# Patient Record
Sex: Male | Born: 1965 | Race: White | Hispanic: No | Marital: Single | Smoking: Current every day smoker
Health system: Southern US, Community
[De-identification: ages and names within clinical notes are randomized; demographics above are authoritative.]

## PROBLEM LIST (undated history)

## (undated) DIAGNOSIS — R001 Bradycardia, unspecified: Secondary | ICD-10-CM

## (undated) DIAGNOSIS — N529 Male erectile dysfunction, unspecified: Secondary | ICD-10-CM

## (undated) DIAGNOSIS — I10 Essential (primary) hypertension: Secondary | ICD-10-CM

## (undated) DIAGNOSIS — M25529 Pain in unspecified elbow: Secondary | ICD-10-CM

## (undated) DIAGNOSIS — F419 Anxiety disorder, unspecified: Secondary | ICD-10-CM

## (undated) DIAGNOSIS — M549 Dorsalgia, unspecified: Secondary | ICD-10-CM

## (undated) DIAGNOSIS — G5600 Carpal tunnel syndrome, unspecified upper limb: Secondary | ICD-10-CM

## (undated) DIAGNOSIS — K219 Gastro-esophageal reflux disease without esophagitis: Secondary | ICD-10-CM

## (undated) DIAGNOSIS — M1990 Unspecified osteoarthritis, unspecified site: Secondary | ICD-10-CM

## (undated) HISTORY — DX: Dorsalgia, unspecified: M54.9

## (undated) HISTORY — PX: WRIST SURGERY: SHX841

## (undated) HISTORY — DX: Pain in unspecified elbow: M25.529

## (undated) HISTORY — PX: KNEE ARTHROSCOPY: SUR90

## (undated) HISTORY — DX: Anxiety disorder, unspecified: F41.9

## (undated) HISTORY — DX: Unspecified osteoarthritis, unspecified site: M19.90

## (undated) HISTORY — PX: FOOT SURGERY: SHX648

## (undated) HISTORY — DX: Male erectile dysfunction, unspecified: N52.9

## (undated) HISTORY — PX: KNEE SURGERY: SHX244

---

## 1998-03-15 ENCOUNTER — Emergency Department (HOSPITAL_COMMUNITY): Admission: EM | Admit: 1998-03-15 | Discharge: 1998-03-15 | Payer: Self-pay | Admitting: Emergency Medicine

## 1998-03-15 ENCOUNTER — Encounter: Payer: Self-pay | Admitting: Emergency Medicine

## 2004-11-02 ENCOUNTER — Emergency Department (HOSPITAL_COMMUNITY): Admission: EM | Admit: 2004-11-02 | Discharge: 2004-11-02 | Payer: Self-pay | Admitting: Family Medicine

## 2005-08-26 ENCOUNTER — Emergency Department (HOSPITAL_COMMUNITY): Admission: EM | Admit: 2005-08-26 | Discharge: 2005-08-26 | Payer: Self-pay | Admitting: Emergency Medicine

## 2005-08-28 ENCOUNTER — Emergency Department (HOSPITAL_COMMUNITY): Admission: EM | Admit: 2005-08-28 | Discharge: 2005-08-28 | Payer: Self-pay | Admitting: Emergency Medicine

## 2005-09-22 ENCOUNTER — Emergency Department (HOSPITAL_COMMUNITY): Admission: EM | Admit: 2005-09-22 | Discharge: 2005-09-22 | Payer: Self-pay | Admitting: Emergency Medicine

## 2006-07-30 ENCOUNTER — Emergency Department (HOSPITAL_COMMUNITY): Admission: EM | Admit: 2006-07-30 | Discharge: 2006-07-30 | Payer: Self-pay | Admitting: Emergency Medicine

## 2007-07-04 ENCOUNTER — Emergency Department (HOSPITAL_COMMUNITY): Admission: EM | Admit: 2007-07-04 | Discharge: 2007-07-04 | Payer: Self-pay | Admitting: Emergency Medicine

## 2008-07-12 ENCOUNTER — Emergency Department (HOSPITAL_COMMUNITY): Admission: EM | Admit: 2008-07-12 | Discharge: 2008-07-12 | Payer: Self-pay | Admitting: Emergency Medicine

## 2008-08-31 ENCOUNTER — Emergency Department (HOSPITAL_COMMUNITY): Admission: EM | Admit: 2008-08-31 | Discharge: 2008-08-31 | Payer: Self-pay | Admitting: Emergency Medicine

## 2009-03-25 ENCOUNTER — Encounter: Admission: RE | Admit: 2009-03-25 | Discharge: 2009-03-25 | Payer: Self-pay | Admitting: Family Medicine

## 2009-05-09 ENCOUNTER — Encounter
Admission: RE | Admit: 2009-05-09 | Discharge: 2009-06-10 | Payer: Self-pay | Admitting: Physical Medicine and Rehabilitation

## 2009-06-12 ENCOUNTER — Encounter
Admission: RE | Admit: 2009-06-12 | Discharge: 2009-07-06 | Payer: Self-pay | Admitting: Physical Medicine and Rehabilitation

## 2011-11-20 ENCOUNTER — Encounter (HOSPITAL_COMMUNITY): Payer: Self-pay | Admitting: *Deleted

## 2011-11-20 ENCOUNTER — Emergency Department (HOSPITAL_COMMUNITY)
Admission: EM | Admit: 2011-11-20 | Discharge: 2011-11-21 | Disposition: A | Discharge status: Home / Self Care | Payer: Medicaid Other | Attending: Emergency Medicine | Admitting: Emergency Medicine

## 2011-11-20 DIAGNOSIS — R21 Rash and other nonspecific skin eruption: Secondary | ICD-10-CM | POA: Insufficient documentation

## 2011-11-20 DIAGNOSIS — F17200 Nicotine dependence, unspecified, uncomplicated: Secondary | ICD-10-CM | POA: Insufficient documentation

## 2011-11-20 NOTE — ED Notes (Signed)
Pt c/o getting bit by something left leg three days ago; since then increased areas of irritation and redness noted

## 2011-11-21 LAB — CBC
HCT: 40.4 % (ref 39.0–52.0)
Hemoglobin: 14 g/dL (ref 13.0–17.0)
MCH: 31.1 pg (ref 26.0–34.0)
MCHC: 34.7 g/dL (ref 30.0–36.0)
MCV: 89.8 fL (ref 78.0–100.0)
Platelets: 271 10*3/uL (ref 150–400)
RBC: 4.5 MIL/uL (ref 4.22–5.81)
RDW: 12.9 % (ref 11.5–15.5)
WBC: 9.9 10*3/uL (ref 4.0–10.5)

## 2011-11-21 MED ORDER — HYDROCORTISONE 1 % EX CREA: TOPICAL_CREAM | CUTANEOUS | Status: AC

## 2011-11-21 NOTE — ED Provider Notes (Signed)
History     CSN: [id number]  Arrival date & time 11/20/11  2219   First MD Initiated Contact with Patient 11/20/11 2333      Chief Complaint  Patient presents with  . Insect Bite    (Consider location/radiation/quality/duration/timing/severity/associated sxs/prior treatment) HPI Comments: Patient reports that he noticed erythematous patches on the anterior left lower leg three days ago.  He thinks that he may have been bitten by an insect, but he did not see or feel an insect bite him.  He has never had anything like this before.  The area burns and itches.  He has not tried applying any treatment to the area.    Patient is a 46 y.o. male presenting with rash. The history is provided by the patient.  Rash  This is a new problem. Episode onset: 3 days ago. The problem has not changed since onset.Associated with: possible insect bite. There has been no fever. The rash is present on the left lower leg. Associated symptoms include itching. Pertinent negatives include no blisters and no weeping. He has tried nothing for the symptoms.    History reviewed. No pertinent past medical history.  Past Surgical History  Procedure Date  . Knee arthroscopy     No family history on file.  History  Substance Use Topics  . Smoking status: Current Everyday Smoker -- 1.0 packs/day  . Smokeless tobacco: Not on file  . Alcohol Use: Yes     social      Review of Systems  Constitutional: Negative for fever and chills.  HENT: Negative for sore throat, neck pain and neck stiffness.   Respiratory: Negative for shortness of breath.   Gastrointestinal: Negative for nausea and vomiting.  Skin: Positive for itching and rash. Negative for wound.  Neurological: Negative for numbness and headaches.  Hematological: Does not bruise/bleed easily.    Allergies  Review of patient's allergies indicates no known allergies.  Home Medications   Current Outpatient Rx  Name Route Sig Dispense Refill  .  HYDROCODONE-ACETAMINOPHEN 10-325 MG PO TABS Oral Take 1 tablet by mouth every 6 (six) hours as needed.    . IBUPROFEN 800 MG PO TABS Oral Take 800 mg by mouth every 8 (eight) hours as needed.    Marland Kitchen HYDROCORTISONE 1 % EX CREA  Apply to affected area 2 times daily 15 g 0    BP 132/92  Pulse 88  Temp 98.7 F (37.1 C) (Oral)  Resp 18  Wt 225 lb (102.059 kg)  SpO2 97%  Physical Exam  Nursing note and vitals reviewed. Constitutional: He appears well-developed and well-nourished. No distress.  HENT:  Head: Normocephalic and atraumatic.  Mouth/Throat: Oropharynx is clear and moist.  Neck: Normal range of motion. Neck supple.  Cardiovascular: Normal rate, regular rhythm and normal heart sounds.   Pulmonary/Chest: Effort normal and breath sounds normal.  Musculoskeletal: Normal range of motion.  Neurological: He is alert.  Skin: Skin is warm and dry. He is not diaphoretic.     Psychiatric: He has a normal mood and affect.    ED Course  Procedures (including critical care time)   Labs Reviewed  CBC  LAB REPORT - SCANNED   No results found.   1. Rash       MDM  Patient presenting with rash.  CBC checked to evaluate platelet count.  Platelet count normal.  Patient has a PCP.  Patient instructed to follow up with his PCP and also given follow up with Dermatology.  Pascal Lux [place], PA-C 11/21/11 2330  Pascal Lux [place], PA-C 11/21/11 [phone]

## 2011-11-22 NOTE — ED Provider Notes (Signed)
Medical screening examination/treatment/procedure(s) were performed by non-physician practitioner and as supervising physician I was immediately available for consultation/collaboration.  Ulonda Klosowski, MD 11/22/11 2346 

## 2013-09-19 ENCOUNTER — Emergency Department (HOSPITAL_COMMUNITY)
Admission: EM | Admit: 2013-09-19 | Discharge: 2013-09-19 | Disposition: A | Discharge status: Home / Self Care | Payer: Medicaid Other | Attending: Emergency Medicine | Admitting: Emergency Medicine

## 2013-09-19 ENCOUNTER — Encounter (HOSPITAL_COMMUNITY): Payer: Self-pay | Admitting: Emergency Medicine

## 2013-09-19 DIAGNOSIS — M545 Low back pain, unspecified: Secondary | ICD-10-CM | POA: Insufficient documentation

## 2013-09-19 DIAGNOSIS — G8929 Other chronic pain: Secondary | ICD-10-CM

## 2013-09-19 DIAGNOSIS — F17200 Nicotine dependence, unspecified, uncomplicated: Secondary | ICD-10-CM | POA: Insufficient documentation

## 2013-09-19 DIAGNOSIS — M549 Dorsalgia, unspecified: Secondary | ICD-10-CM

## 2013-09-19 MED ORDER — HYDROCODONE-ACETAMINOPHEN 5-325 MG PO TABS: 1.0000 | ORAL_TABLET | ORAL | Status: DC | PRN

## 2013-09-19 MED ORDER — KETOROLAC TROMETHAMINE 30 MG/ML IJ SOLN
30.0000 mg | Freq: Once | INTRAMUSCULAR | Status: AC
  Administered 2013-09-19: 30 mg via INTRAMUSCULAR
  Filled 2013-09-19: qty 1

## 2013-09-19 MED ORDER — DEXAMETHASONE SODIUM PHOSPHATE 10 MG/ML IJ SOLN
10.0000 mg | Freq: Once | INTRAMUSCULAR | Status: AC
  Administered 2013-09-19: 10 mg via INTRAMUSCULAR
  Filled 2013-09-19: qty 1

## 2013-09-19 MED ORDER — NAPROXEN 500 MG PO TABS: 500.0000 mg | ORAL_TABLET | Freq: Two times a day (BID) | ORAL | Status: DC

## 2013-09-19 MED ORDER — METHOCARBAMOL 500 MG PO TABS: 500.0000 mg | ORAL_TABLET | Freq: Two times a day (BID) | ORAL | Status: DC | PRN

## 2013-09-19 NOTE — ED Provider Notes (Signed)
CSN: [id number]     Arrival date & time 09/19/13  1922 History   First MD Initiated Contact with Patient 09/19/13 2018     Chief Complaint  Patient presents with  . Back Pain    HPI  Jon GuadalajaraWilliam K Hodges is a 48 y.o. male with a PMH of chronic back pain who presents to the ED for evaluation of back pain. History was provided by the patient. Patient states he has had chronic lower back pain for the past 10 years, which has been worse for the past two weeks. Pain is unchanged from his chronic lower back pain. Pain worse with movement. Pain located is his lower middle back without radiation. Pain is a constant and described as aching. Patient has tried Ibuprofen and Robaxin with no relief. Patient has seen Dr. Ethelene Halamos with [facility] in the past but stopping seeing him due to lack of insurance. Denies any injuries or trauma. No weakness, loss of sensation, numbness/tingling, loss of bowel/bladder function, abdominal pain, nausea, emesis, chest pain or SOB. No fevers, weight loss, night sweats, hx of cancer or IV drug use.    History reviewed. No pertinent past medical history. Past Surgical History  Procedure Laterality Date  . Knee arthroscopy    . Foot surgery    . Wrist surgery    . Knee surgery     History reviewed. No pertinent family history. History  Substance Use Topics  . Smoking status: Current Every Day Smoker -- 1.00 packs/day  . Smokeless tobacco: Not on file  . Alcohol Use: Yes     Comment: social    Review of Systems  Constitutional: Negative for fever, chills, diaphoresis, activity change, appetite change and fatigue.  Respiratory: Negative for cough and shortness of breath.   Cardiovascular: Negative for chest pain and leg swelling.  Gastrointestinal: Negative for nausea, vomiting, abdominal pain, diarrhea and constipation.  Genitourinary: Negative for dysuria and difficulty urinating.  Musculoskeletal: Positive for back pain. Negative for arthralgias, gait  problem, joint swelling, myalgias and neck pain.  Skin: Negative for color change and wound.  Neurological: Negative for dizziness, weakness, light-headedness, numbness and headaches.    Allergies  Review of patient's allergies indicates no known allergies.  Home Medications   Current Outpatient Rx  Name  Route  Sig  Dispense  Refill  . ibuprofen (ADVIL,MOTRIN) 600 MG tablet   Oral   Take 600 mg by mouth every 6 (six) hours as needed for mild pain.         . methocarbamol (ROBAXIN) 500 MG tablet   Oral   Take 500 mg by mouth 4 (four) times daily.          BP 100/71  Pulse 104  Temp(Src) 97.3 F (36.3 C) (Oral)  Resp 16  SpO2 98%  Filed Vitals:   09/19/13 1941 09/19/13 2112  BP: 100/71 151/74  Pulse: 104 79  Temp: 97.3 F (36.3 C)   TempSrc: Oral   Resp: 16 16  SpO2: 98% 97%    Physical Exam  Nursing note and vitals reviewed. Constitutional: He is oriented to person, place, and time. He appears well-developed and well-nourished. No distress.  HENT:  Head: Normocephalic and atraumatic.  Right Ear: External ear normal.  Left Ear: External ear normal.  Mouth/Throat: Oropharynx is clear and moist.  Eyes: Conjunctivae are normal. Right eye exhibits no discharge. Left eye exhibits no discharge.  Neck: Normal range of motion. Neck supple.  No cervical spinal or paraspinal tenderness to  palpation throughout.  No limitations with neck ROM.    Cardiovascular: Normal rate, regular rhythm, normal heart sounds and intact distal pulses.  Exam reveals no gallop and no friction rub.   No murmur heard. Dorsalis pedis pulses present and equal bilaterally  Pulmonary/Chest: Effort normal and breath sounds normal. No respiratory distress. He has no wheezes. He has no rales. He exhibits no tenderness.  Abdominal: Soft. He exhibits no distension and no mass. There is no tenderness. There is no rebound and no guarding.  Musculoskeletal: Normal range of motion. He exhibits  tenderness. He exhibits no edema.       Back:  Tenderness to palpation to the lower middle spine and paraspinal muscles diffusely. Negative straight leg raise. Pain worse with sitting up. Strength 5/5 in the upper and lower extremities bilaterally. Patient able to ambulate without difficulty or ataxia.   Neurological: He is alert and oriented to person, place, and time.  Patellar reflexes intact bilaterally. Sensation intact in the LE bilaterally  Skin: Skin is warm and dry. He is not diaphoretic.  No ecchymosis, edema, erythema or wounds throughout    ED Course  Procedures (including critical care time) Labs Review Labs Reviewed - No data to display Imaging Review No results found.   EKG Interpretation None      MDM   REVAN GENDRON is a 48 y.o. male with a PMH of chronic back pain who presents to the ED for evaluation of back pain. Back pain is chronic and unchanged from his musculoskeletal back pain in the past. Patient has an orthopedic physician who is managing his back pain. No warning signs or symptoms of back pain including loss of bowel or bladder control, night sweats, waking from sleep with back pain, unexplained fevers or weight loss, history of cancer, or IV drug use. No concern for cauda equina, epidural abscess, or other serious/life threatening cause of back pain. Patient instructed to follow-up with his PCP and or orthopedic specialist. RICE method discussed. Medications prescribed which have worked for the patient in the past. Return precautions, discharge instructions, and follow-up was discussed with the patient before discharge.     Rechecks  9:30 PM = Pain improving. Patient ready for discharge.      Discharge Medication List as of 09/19/2013  9:32 PM    START taking these medications   Details  HYDROcodone-acetaminophen (NORCO/VICODIN) 5-325 MG per tablet Take 1 tablet by mouth every 4 (four) hours as needed., Starting 09/19/2013, Until Discontinued, Print     !! methocarbamol (ROBAXIN) 500 MG tablet Take 1 tablet (500 mg total) by mouth 2 (two) times daily as needed for muscle spasms., Starting 09/19/2013, Until Discontinued, Print    naproxen (NAPROSYN) 500 MG tablet Take 1 tablet (500 mg total) by mouth 2 (two) times daily with a meal., Starting 09/19/2013, Until Discontinued, Print     !! - Potential duplicate medications found. Please discuss with provider.       Final impressions: 1. Chronic back pain       Greer Ee Tayven Renteria PA-C          Jillyn Ledger, [place] 09/20/13 1534

## 2013-09-19 NOTE — ED Notes (Signed)
Patient is alert and oriented x3.  He is complaining of back pain that he has been dealing with chronically  With the help of an orthopedic doctor but his insurance stopped and he could not continue due to expense. Currently he rates his pain 10 of 10.  He is able to ambulate but does have difficulty.

## 2013-09-19 NOTE — Discharge Instructions (Signed)
Take naprosyn twice daily with food  Take robaxin for muscle spasm - take at night - Please be careful with this medication.  It can cause drowsiness.  Use caution while driving, operating machinery, drinking alcohol, or any other activities that may impair your physical or mental abilities.   Take Vicodin as needed for severe break through pain - careful with combining this with robaxin - this can make your drowsy - Please be careful with this medication.  It can cause drowsiness.  Use caution while driving, operating machinery, drinking alcohol, or any other activities that may impair your physical or mental abilities.   Return to the emergency department if you develop any changing/worsening condition, loss of bowel/bladder function, weakness, loss of sensation, fever, or any other concerns (please read additional information regarding your condition below) Back Pain, Adult Low back pain is very common. About 1 in 5 people have back pain.The cause of low back pain is rarely dangerous. The pain often gets better over time.About half of people with a sudden onset of back pain feel better in just 2 weeks. About 8 in 10 people feel better by 6 weeks.  CAUSES Some common causes of back pain include:  Strain of the muscles or ligaments supporting the spine.  Wear and tear (degeneration) of the spinal discs.  Arthritis.  Direct injury to the back. DIAGNOSIS Most of the time, the direct cause of low back pain is not known.However, back pain can be treated effectively even when the exact cause of the pain is unknown.Answering your caregiver's questions about your overall health and symptoms is one of the most accurate ways to make sure the cause of your pain is not dangerous. If your caregiver needs more information, he or she may order lab work or imaging tests (X-rays or MRIs).However, even if imaging tests show changes in your back, this usually does not require surgery. HOME CARE  INSTRUCTIONS For many people, back pain returns.Since low back pain is rarely dangerous, it is often a condition that people can learn to [facility] their own.   Remain active. It is stressful on the back to sit or stand in one place. Do not sit, drive, or stand in one place for more than 30 minutes at a time. Take short walks on level surfaces as soon as pain allows.Try to increase the length of time you walk each day.  Do not stay in bed.Resting more than 1 or 2 days can delay your recovery.  Do not avoid exercise or work.Your body is made to move.It is not dangerous to be active, even though your back may hurt.Your back will likely heal faster if you return to being active before your pain is gone.  Pay attention to your body when you bend and lift. Many people have less discomfortwhen lifting if they bend their knees, keep the load close to their bodies,and avoid twisting. Often, the most comfortable positions are those that put less stress on your recovering back.  Find a comfortable position to sleep. Use a firm mattress and lie on your side with your knees slightly bent. If you lie on your back, put a pillow under your knees.  Only take over-the-counter or prescription medicines as directed by your caregiver. Over-the-counter medicines to reduce pain and inflammation are often the most helpful.Your caregiver may prescribe muscle relaxant drugs.These medicines help dull your pain so you can more quickly return to your normal activities and healthy exercise.  Put ice on the injured area.  Put ice in a plastic bag.  Place a towel between your skin and the bag.  Leave the ice on for 15-20 minutes, 03-04 times a day for the first 2 to 3 days. After that, ice and heat may be alternated to reduce pain and spasms.  Ask your caregiver about trying back exercises and gentle massage. This may be of some benefit.  Avoid feeling anxious or stressed.Stress increases muscle tension and  can worsen back pain.It is important to recognize when you are anxious or stressed and learn ways to manage it.Exercise is a great option. SEEK MEDICAL CARE IF:  You have pain that is not relieved with rest or medicine.  You have pain that does not improve in 1 week.  You have new symptoms.  You are generally not feeling well. SEEK IMMEDIATE MEDICAL CARE IF:   You have pain that radiates from your back into your legs.  You develop new bowel or bladder control problems.  You have unusual weakness or numbness in your arms or legs.  You develop nausea or vomiting.  You develop abdominal pain.  You feel faint. Document Released: 05/28/2005 Document Revised: 11/27/2011 Document Reviewed: 10/16/2010 [facility] Patient Information 2014 [place], [place].   Emergency Department Resource Guide 1) Find a Doctor and Pay Out of Pocket Although you won't have to find out who is covered by your insurance plan, it is a good idea to ask around and get recommendations. You will then need to call the office and see if the doctor you have chosen will accept you as a new patient and what types of options they offer for patients who are self-pay. Some doctors offer discounts or will set up payment plans for their patients who do not have insurance, but you will need to ask so you aren't surprised when you get to your appointment.  2) Contact Your Local Health Department Not all health departments have doctors that can see patients for sick visits, but many do, so it is worth a call to see if yours does. If you don't know where your local health department is, you can check in your phone book. The CDC also has a tool to help you locate your state's health department, and many state websites also have listings of all of their local health departments.  3) Find a Walk-in Clinic If your illness is not likely to be very severe or complicated, you may want to try a walk in clinic. These are popping up all over  the country in pharmacies, drugstores, and shopping centers. They're usually staffed by nurse practitioners or physician assistants that have been trained to treat common illnesses and complaints. They're usually fairly quick and inexpensive. However, if you have serious medical issues or chronic medical problems, these are probably not your best option.  No Primary Care Doctor: - Call Health Connect at  [phone] - they can help you locate a primary care doctor that  accepts your insurance, provides certain services, etc. - Physician Referral Service- [phone]  Chronic Pain Problems: Organization         Address  Phone   Notes  [facility]  [phone] Patients need to be referred by their primary care doctor.   Medication Assistance: Organization         Address  Phone   Notes  [facility] Medication [facility] [address] [place], [place] [address] [phone] --Must be a resident of [facility] -- Must  have NO insurance coverage whatsoever (no Medicaid/ Medicare, etc.) -- The pt. MUST have a primary care doctor that directs their care regularly and follows them in the community   [facility]  [phone]   [employer]  [phone]    Agencies that provide inexpensive medical care: Organization         Address  Phone   Notes  [facility]  [phone]   [facility]    [phone]   [facility] [address] [place], [place] [address] [phone]   [facility] [address] [place] [place] [phone]   Planned Parenthood    [phone]   [facility]    [phone]   [facility]  [address], [place] Phone:  [phone], Fax:  [phone] Hours of Operation:  9 am - 6 pm, M-F.  Also accepts Medicaid/Medicare and self-pay.  [facility]  [address]  [address], [place] Phone: [phone], Fax: [phone]. Hours of Operation:  8:30 am - 5:30 pm, M-F.  Also accepts Medicaid and self-pay.  [facility] [place] [address] [place] Phone: [phone]   [facility] [address] Natasha Bence [place], [place] [phone], Ext. [phone] Mondays & Thursdays: 7-9 AM.  First 15 patients are seen on a first come, first serve basis.    Medicaid-accepting [facility] Providers:  Organization         Address  Phone   Notes  [facility] [address], [place] [phone] Also accepts self-pay patients.  [facility] [address] Laurell Josephs [place], [place]  [phone]   [facility] [address], [place] [phone]   [facility] [address] [place] [phone]   Renaye Rakers [address], [place]   [phone] Only accepts [place] Access [place] patients after they have their name applied to their card.   Self-Pay (no insurance) in [facility]:  Organization         Address  Phone   Notes  Sickle Cell Patients, [facility] [address] [place], [place] [phone]   [facility] [address] [place], [place] [phone]   [facility] [place]  [address], [place] [phone]   [facility]/Dr. Osei-Bonsu  [address], [place] or [address], [place] [phone] Phone number for both [place] and [employer] locations is the same.  [facility] [address], [place] [phone]   [facility] [address] [place] or [address] [phone]   [facility] [address] [place] [phone], phone; [phone], fax Sees patients 1st and 3rd Saturday of every month.   Must not qualify for public or private insurance (i.e. Medicaid, Medicare, [place] Health Choice, Veterans' Benefits)  Household income should be no more than 200% of the poverty level The clinic cannot treat you if you are pregnant or think you are pregnant  Sexually transmitted diseases are not treated at the clinic.    Dental Care: Organization         Address  Phone  Notes  [facility] Department of [facility] [address] [place], [place] [phone] Accepts children up to age 75 who are enrolled in [place] or [place] Health Choice; pregnant women with a Medicaid card; and children who have applied for Medicaid or [place] Health Choice, but were declined, whose parents can pay a reduced fee at time of service.  [facility] Department of [facility]  [address], [place] [phone] Accepts children up to age 40 who are enrolled in [place] or [place] Health Choice; pregnant women with a Medicaid card; and children who have applied for Medicaid or [place] Health Choice, but were declined, whose parents can pay a reduced fee at time of service.  [facility]  [address] [place], [place] [phone] Patients are seen by appointment only. Walk-ins are not accepted. [facility] will see patients 9 years of age and older. Monday - Tuesday (8am-5pm) Most Wednesdays (8:30-5pm) $30 per visit, cash only  [facility] Adult [facility] Access [facility]  [address], [facility] [phone] Patients are seen by appointment only. Walk-ins are not accepted. [facility] will see patients 4 years of age and older. One Wednesday Evening (Monthly: Volunteer Based).  $30 per visit, cash only  [employer]  [phone] for adults; Children under age 45, call Graduate Pediatric Dentistry at [phone]. Children aged 98-14, please call [phone] to request a pediatric application.  Dental services are  provided in all areas of dental care including fillings, crowns and bridges, complete and partial dentures, implants, gum treatment, root canals, and extractions. Preventive care is also provided. Treatment is provided to both adults and children. Patients are selected via a lottery and there is often a waiting list.   [facility] [address], [place]  [phone] [email or web address]   [facility] [address] [place], [place] [phone], Ext. [phone] Second and Fourth Thursday of each month, opens at 6:30 AM; Clinic ends at 9 AM.  Patients are seen on a first-come first-served basis, and a limited number are seen during each clinic.   [facility]  [address] Ether Griffins [place], [place] [phone]   Eligibility Requirements You must have lived in [place], [place], or [place] counties for at least the last three months.   You cannot be eligible for state or federal sponsored [place], including [employer], [place], or [employer].   You generally cannot be eligible for healthcare insurance through your employer.    How to apply: Eligibility screenings are held every Tuesday and Wednesday afternoon from 1:00 pm until 4:00 pm. You do not need an appointment for the interview!  [facility] [address], [place], [place] [phone]   [facility] Health Department  [phone]   [facility] Health Department  [phone]   [facility] Health Department  [phone]    Behavioral Health Resources in the Community: Intensive Outpatient Programs Organization         Address  Phone  Notes  [facility] [address] [place], [place] [phone]   [facility] Outpatient [address], [place], [place] [phone]   [facility] [address] [place], [place]  [phone]   [facility] Mental Health [address]  [place], [place]  [phone] or [phone]   Substance Abuse Resources Organization  Address  Phone  Notes  [facility]  4197317933   [facility]  [phone]   [facility] [place]  [phone]   Floydene Flock  [phone]   Residential & Outpatient Substance Abuse Program  [phone]   Psychological Services Organization         Address  Phone  Notes  [facility] Behavioral Health  [phone]   [facility]  [phone]   [facility] Mental Health [address], [place] [phone] or [phone]    Mobile Crisis Teams Organization         Address  Phone  Notes  [facility], Mobile Crisis Care Unit  [phone]   [facility]  [address] [place], [place] [phone]   Doristine Locks [address] [place] [phone]    Self-Help/Support Groups Organization         Address  Phone             Notes  [facility] [place] - variety of support groups  336- I7437963 Call for more information  Narcotics Anonymous (NA), [facility] [address], [employer] [place]  2 meetings at this location   Statistician         Address  Phone  Notes  [facility] [address],    [place]  [phone]   [facility]  [address], [place] [address], [place], [place] [phone]   [facility] [address] [place], [place] [phone] Admissions: 8am-3pm M-F  [facility] [address]    [place], [place] [phone]   [facility] [address] [place], [place], [place] [phone]   [facility] [address]  [place], [place] [phone]   [facility] - Intensive Outpatient [address]urell [address], [place], [place] [phone]   [facility] ([facility] [address]  [place], [place] [phone] or  [phone]   [facility] ([facility]) [address], [place], [place] [phone] Accepts Medicaid  [facility] [address]  [place] [phone] Substance Abuse/Addiction Treatment   [facility] Organization         Address  Phone  Notes  [facility]  [phone]   Angie Fava, PhD [address] Ervin Knack [place], [place]   [phone] or [phone]   [facility]   [address] [place], [place] [phone]   [facility] [address] [place], [place] [phone] Insurance/Medicaid/sponsorship through [facility] and Families [address]                                    [place], [place] [phone] Therapy/tele-psych/case  [facility] [address] [place], [place] [phone]    Dr. Lolly Mustache  [phone]   [facility]  [employer] [facility] Dept. 1) [address], [place] 2) [address] [place] 3)  [address], [place] [phone]  [phone]   [facility] Child Abuse Hotline 507-693-3690 or [phone] (After Hours)

## 2013-09-20 NOTE — ED Provider Notes (Signed)
Medical screening examination/treatment/procedure(s) were performed by non-physician practitioner and as supervising physician I was immediately available for consultation/collaboration.    Melady Chow R Emmah Bratcher, MD 09/20/13 2352 

## 2013-09-21 ENCOUNTER — Emergency Department (HOSPITAL_COMMUNITY)
Admission: EM | Admit: 2013-09-21 | Discharge: 2013-09-21 | Disposition: A | Discharge status: Home / Self Care | Payer: Medicaid Other | Attending: Emergency Medicine | Admitting: Emergency Medicine

## 2013-09-21 ENCOUNTER — Encounter (HOSPITAL_COMMUNITY): Payer: Self-pay | Admitting: Emergency Medicine

## 2013-09-21 DIAGNOSIS — M545 Low back pain, unspecified: Secondary | ICD-10-CM | POA: Insufficient documentation

## 2013-09-21 DIAGNOSIS — R634 Abnormal weight loss: Secondary | ICD-10-CM | POA: Insufficient documentation

## 2013-09-21 DIAGNOSIS — G8929 Other chronic pain: Secondary | ICD-10-CM | POA: Insufficient documentation

## 2013-09-21 DIAGNOSIS — Z791 Long term (current) use of non-steroidal anti-inflammatories (NSAID): Secondary | ICD-10-CM | POA: Insufficient documentation

## 2013-09-21 DIAGNOSIS — Z79899 Other long term (current) drug therapy: Secondary | ICD-10-CM | POA: Insufficient documentation

## 2013-09-21 DIAGNOSIS — F17200 Nicotine dependence, unspecified, uncomplicated: Secondary | ICD-10-CM | POA: Insufficient documentation

## 2013-09-21 DIAGNOSIS — R52 Pain, unspecified: Secondary | ICD-10-CM | POA: Insufficient documentation

## 2013-09-21 MED ORDER — OXYCODONE-ACETAMINOPHEN 5-325 MG PO TABS: 1.0000 | ORAL_TABLET | ORAL | Status: DC | PRN

## 2013-09-21 MED ORDER — IBUPROFEN 800 MG PO TABS: 800.0000 mg | ORAL_TABLET | Freq: Three times a day (TID) | ORAL | Status: DC

## 2013-09-21 MED ORDER — HYDROMORPHONE HCL PF 1 MG/ML IJ SOLN
1.0000 mg | Freq: Once | INTRAMUSCULAR | Status: AC
  Administered 2013-09-21: 1 mg via INTRAMUSCULAR
  Filled 2013-09-21: qty 1

## 2013-09-21 MED ORDER — DIAZEPAM 5 MG PO TABS
5.0000 mg | ORAL_TABLET | Freq: Two times a day (BID) | ORAL | Status: DC
Start: 2013-09-21 — End: 2015-12-27

## 2013-09-21 MED ORDER — KETOROLAC TROMETHAMINE 60 MG/2ML IM SOLN
60.0000 mg | Freq: Once | INTRAMUSCULAR | Status: AC
  Administered 2013-09-21: 60 mg via INTRAMUSCULAR
  Filled 2013-09-21: qty 2

## 2013-09-21 NOTE — ED Provider Notes (Signed)
CSN: [id number]     Arrival date & time 09/21/13  1535 History  This chart was scribed for non-physician practitioner, Margaret PyleKatie SchinIver, PA-C working with Glynn OctaveStephen Rancour, MD by Luisa DagoPriscilla Tutu, ED scribe. This patient was seen in room TR06C/TR06C and the patient's care was started at 6:25 PM.    Chief Complaint  Patient presents with  . Back Pain    The history is provided by the patient. No language interpreter was used.   HPI Comments: Carnella GuadalajaraWilliam K Lawrence is a 48 y.o. male with a history of chronic back pain (10 years) who presents to the Emergency Department requesting a re-evaluation of his constant back pain that started to worsen about 3 weeks ago. He states that his back pain has been worsening in the past 2 weeks Pt states that he has been without insurance and unable to [facility]. Mr. Luiz BlareGraves describes his back pain as "a burning sensation".He states that [facility] and was prescribed 6 Hydrocodone-acetaminophen, methocarbamol 500 MG, and Naproxen 500 MG. Mr. Luiz BlareGraves says that the medication [facility] are not relieving his pain. Pt states that pain is relieved by applying pressure. Denies any recent injury. He also denies any weakness, saddle paraesthesia, fever, chills, abdominal pain, bowel or bladder incontinence.   Wife states that pt has lost about 25 lbs due to the chronic back pain.   Pt states that he is trying to schedule and appointment with an orthopedist. But they were told that the are booked right now but they may be able to get him in on Friday.   History reviewed. No pertinent past medical history. Past Surgical History  Procedure Laterality Date  . Knee arthroscopy    . Foot surgery    . Wrist surgery    . Knee surgery     History reviewed. No pertinent family history. History  Substance Use Topics  . Smoking status: Current Every Day Smoker -- 1.00 packs/day  .  Smokeless tobacco: Not on file  . Alcohol Use: Yes     Comment: social    Review of Systems  Musculoskeletal: Positive for back pain.  All other systems reviewed and are negative.     Allergies  Review of patient's allergies indicates no known allergies.  Home Medications   Current Outpatient Rx  Name  Route  Sig  Dispense  Refill  . HYDROcodone-acetaminophen (NORCO/VICODIN) 5-325 MG per tablet   Oral   Take 1 tablet by mouth every 4 (four) hours as needed.   6 tablet   0   . ibuprofen (ADVIL,MOTRIN) 600 MG tablet   Oral   Take 600 mg by mouth every 6 (six) hours as needed for mild pain.         . methocarbamol (ROBAXIN) 500 MG tablet   Oral   Take 500 mg by mouth 4 (four) times daily.         . methocarbamol (ROBAXIN) 500 MG tablet   Oral   Take 1 tablet (500 mg total) by mouth 2 (two) times daily as needed for muscle spasms.   20 tablet   0   . naproxen (NAPROSYN) 500 MG tablet   Oral   Take 1 tablet (500 mg total) by mouth 2 (two) times daily with a meal.   30 tablet   0    BP 134/73  Pulse 85  Temp(Src) 97.5 F (36.4 C) (Oral)  Resp 20  Wt 217 lb 8 oz (98.657 kg)  SpO2 100%  Physical Exam  Nursing note and vitals reviewed. Constitutional: He is oriented to person, place, and time. He appears well-developed and well-nourished.  Uncomfortable appearing.  HENT:  Head: Normocephalic and atraumatic.  Eyes:  Normal appearance  Neck: Normal range of motion.  Cardiovascular: Normal rate and regular rhythm.   Pulmonary/Chest: Effort normal and breath sounds normal. No respiratory distress.  Genitourinary:  No CVA ttp  Musculoskeletal: Normal range of motion.  Entire low back non-tender. Full active ROM of LE.  Nml patellar reflexes.  No saddle anesthesia. Distal sensation intact.  2+ DP pulses.  Ambulates w/out diffulty.   Neurological: He is alert and oriented to person, place, and time.  Skin: Skin is warm and dry. No rash noted.  Psychiatric:  He has a normal mood and affect. His behavior is normal.    ED Course  Procedures (including critical care time)  DIAGNOSTIC STUDIES: Oxygen Saturation is 100% on RA, normal by my interpretation.    COORDINATION OF CARE: 6:32 PM- Will give him an injection of pain medication and antiinflammatory medication. Pt advised of plan for treatment and pt agrees.  Labs Review Labs Reviewed - No data to display Imaging Review No results found.   EKG Interpretation None      MDM   Final diagnoses:  Chronic low back pain    47yo M presents w/ acute on chronic low back pain.  H/o facet arthropathy. Second visit in 2 days but has not been seen for this problem here in the past.  [facility] squeeze him in for f/u this week.  On exam, afebrile, non-toxic appearing, NAD, no bony ttp, no NV deficits BLE, ambulatory.  Treated w/ 1mg  IM dilaudid and 60mg  toradol, and d/c'd home w/ percocet, valium and 800mg  ibuprofen.  Advised close f/u w/ Dr. Ethelene Halamos. Return precautions discussed. 6:41 PM   I personally performed the services described in this documentation, which was scribed in my presence. The recorded information has been reviewed and is accurate.    Arie Sabinaatherine E Leeon Makar, PA-C 09/22/13 0200

## 2013-09-21 NOTE — Discharge Instructions (Signed)
Take percocet and/or valium as needed for severe pain.  Do not drive within four hours of taking these medications (may cause drowsiness or confusion).   Take ibuprofen as well; up to 800mg  three times a day with food.  Apply a heating pad or ice pack to your lower back multiple times a day.  Avoid activities that aggravate pain.   Follow up with Dr. Ethelene Halamos asap.  You should return to the ER if you develop change in or worsening of pain, fever (100.5 degrees or greater), inability to walk due to leg weakness or loss of control of bladder/bowels.

## 2013-09-21 NOTE — ED Notes (Signed)
Pt in c/o chronic back pain, states he has been without insurance and unable to see his PMD, went to [facility] for same a few days ago and was given some medications but pain has returned, denies new injuries

## 2013-09-22 NOTE — ED Provider Notes (Signed)
Medical screening examination/treatment/procedure(s) were performed by non-physician practitioner and as supervising physician I was immediately available for consultation/collaboration.   EKG Interpretation None       Milda Lindvall, MD 09/22/13 0206 

## 2013-10-16 ENCOUNTER — Other Ambulatory Visit: Payer: Self-pay | Admitting: Orthopaedic Surgery

## 2013-10-16 DIAGNOSIS — M545 Low back pain, unspecified: Secondary | ICD-10-CM

## 2013-11-07 ENCOUNTER — Ambulatory Visit
Admission: RE | Admit: 2013-11-07 | Discharge: 2013-11-07 | Disposition: A | Discharge status: Home / Self Care | Payer: Medicaid Other | Source: Ambulatory Visit | Attending: Orthopaedic Surgery | Admitting: Orthopaedic Surgery

## 2013-11-07 DIAGNOSIS — M545 Low back pain, unspecified: Secondary | ICD-10-CM

## 2013-11-16 ENCOUNTER — Other Ambulatory Visit: Payer: Medicaid Other

## 2015-12-27 ENCOUNTER — Emergency Department (HOSPITAL_COMMUNITY)
Admission: EM | Admit: 2015-12-27 | Discharge: 2015-12-27 | Disposition: A | Discharge status: Home / Self Care | Payer: Medicaid Other | Attending: Emergency Medicine | Admitting: Emergency Medicine

## 2015-12-27 ENCOUNTER — Emergency Department (HOSPITAL_COMMUNITY): Payer: Medicaid Other

## 2015-12-27 ENCOUNTER — Encounter (HOSPITAL_COMMUNITY): Payer: Self-pay | Admitting: Neurology

## 2015-12-27 DIAGNOSIS — Y9389 Activity, other specified: Secondary | ICD-10-CM | POA: Insufficient documentation

## 2015-12-27 DIAGNOSIS — Y929 Unspecified place or not applicable: Secondary | ICD-10-CM | POA: Diagnosis not present

## 2015-12-27 DIAGNOSIS — M25512 Pain in left shoulder: Secondary | ICD-10-CM

## 2015-12-27 DIAGNOSIS — F17200 Nicotine dependence, unspecified, uncomplicated: Secondary | ICD-10-CM | POA: Diagnosis not present

## 2015-12-27 DIAGNOSIS — X503XXA Overexertion from repetitive movements, initial encounter: Secondary | ICD-10-CM | POA: Insufficient documentation

## 2015-12-27 DIAGNOSIS — Y990 Civilian activity done for income or pay: Secondary | ICD-10-CM | POA: Diagnosis not present

## 2015-12-27 HISTORY — DX: Carpal tunnel syndrome, unspecified upper limb: G56.00

## 2015-12-27 MED ORDER — CYCLOBENZAPRINE HCL 5 MG PO TABS: 5.0000 mg | ORAL_TABLET | Freq: Three times a day (TID) | ORAL | Status: DC | PRN

## 2015-12-27 MED ORDER — CYCLOBENZAPRINE HCL 10 MG PO TABS
5.0000 mg | ORAL_TABLET | Freq: Once | ORAL | Status: AC
  Administered 2015-12-27: 5 mg via ORAL
  Filled 2015-12-27: qty 1

## 2015-12-27 MED ORDER — NAPROXEN 500 MG PO TABS: ORAL_TABLET | ORAL | Status: DC

## 2015-12-27 MED ORDER — HYDROCODONE-ACETAMINOPHEN 5-325 MG PO TABS
1.0000 | ORAL_TABLET | Freq: Once | ORAL | Status: AC
  Administered 2015-12-27: 1 via ORAL
  Filled 2015-12-27: qty 1

## 2015-12-27 NOTE — Discharge Instructions (Signed)
Mr. Jon Hodges,  There was no sign of fracture or dislocation of your left shoulder on x-ray. Since pain improved with muscle relaxant, your pain is likely musculoskeletal. I have prescribed naproxen (like a strong ibuprofen) and muscle relaxant flexeril. Flexeril can make you sleepy, so you may want to use this before bed if you go back to work. Heat may also help the pain. If pain does not improve over next few days, please call Sport Medicine for an appointment. Their number is [phone].

## 2015-12-27 NOTE — ED Provider Notes (Signed)
CSN: [id number]     Arrival date & time 12/27/15  0700 History   First MD Initiated Contact with Patient 12/27/15 [phone]     Chief Complaint  Patient presents with  . Shoulder Pain   (Consider location/radiation/quality/duration/timing/severity/associated sxs/prior Treatment) HPI  Jon Hodges is a 49-y/o male with history of tobacco abuse, psoriasis, lumbar degenerative changes and R carpal tunnel syndrome who presents with acute L shoulder pain since he woke up this morning around 6 a.m. Pain is localized to L anterior shoulder. He denies sensation of chest pressure. He has a very physical job in [employer] but did not work yesterday. He denies any previous shoulder pain or injury to the area. No headache, abdominal pain, back pain, fevers or chills. No vomiting or nausea.   Past Medical History  Diagnosis Date  . Carpal tunnel syndrome    Past Surgical History  Procedure Laterality Date  . Knee arthroscopy    . Foot surgery    . Wrist surgery    . Knee surgery     No family history on file. Social History  Substance Use Topics  . Smoking status: Current Every Day Smoker -- 1.00 packs/day  . Smokeless tobacco: None  . Alcohol Use: Yes     Comment: social    Review of Systems  Constitutional: Negative for activity change.  HENT: Negative for congestion and rhinorrhea.   Respiratory: Negative for shortness of breath.   Cardiovascular: Negative for chest pain and palpitations.  Gastrointestinal: Negative for nausea, vomiting, abdominal pain and diarrhea.  Musculoskeletal: Negative for back pain.  Skin: Negative for rash.  Neurological: Negative for weakness and headaches.    Allergies  Review of patient's allergies indicates no known allergies.  Home Medications   Prior to Admission medications   Medication Sig Start Date End Date Taking? Authorizing Provider  Ascorbic Acid (VITAMIN C) 1000 MG tablet Take 1,000 mg by mouth daily.   Yes Historical Provider,  MD  diazepam (VALIUM) 5 MG tablet Take 5 mg by mouth every 12 (twelve) hours as needed for anxiety.   Yes Historical Provider, MD  DULoxetine (CYMBALTA) 30 MG capsule Take 30 mg by mouth daily. 11/30/15  Yes Historical Provider, MD  gabapentin (NEURONTIN) 300 MG capsule Take 300 mg by mouth at bedtime. 11/30/15  Yes Historical Provider, MD  hydrocortisone cream 0.5 % Apply 1 application topically 2 (two) times daily as needed for itching.   Yes Historical Provider, MD  ibuprofen (ADVIL,MOTRIN) 800 MG tablet Take 800 mg by mouth every 8 (eight) hours as needed (pain).   Yes Historical Provider, MD  Omega-3 Fatty Acids (OMEGA-3 FISH OIL PO) Take 1 tablet by mouth daily.   Yes Historical Provider, MD  cyclobenzaprine (FLEXERIL) 5 MG tablet Take 1 tablet (5 mg total) by mouth 3 (three) times daily as needed for muscle spasms. 12/27/15   Hillary Percell Boston, MD  naproxen (NAPROSYN) 500 MG tablet Take 1 tablet with meals as needed for pain up to 2 times daily. 12/27/15   Hillary Percell Boston, MD   BP 140/93 mmHg  Pulse 57  Temp(Src) 98.1 F (36.7 C) (Oral)  Resp 14  SpO2 94% Physical Exam  Constitutional: He is oriented to person, place, and time. He appears well-developed and well-nourished.  HENT:  Head: Normocephalic and atraumatic.  Mouth/Throat: Oropharynx is clear and moist.  Eyes: Conjunctivae and EOM are normal. Pupils are equal, round, and reactive to light.  Neck: Normal range of motion. Neck supple.  Cardiovascular: Normal rate and regular rhythm.   Pulmonary/Chest: Effort normal and breath sounds normal.  Abdominal: Soft. Bowel sounds are normal. He exhibits no distension. There is no tenderness. There is no rebound and no guarding.  Musculoskeletal:  Empty can test negative bilaterally. Decreased range of motion with Apley Scratch test with left arm due to discomfort. Subacromial point tenderness on the left. Strength 5/5 of UEs and LEs.   Neurological: He is alert and oriented  to person, place, and time.  Skin: Skin is warm and dry.  Nursing note and vitals reviewed.   ED Course  Procedures (including critical care time) Labs Review Labs Reviewed - No data to display  Imaging Review Dg Shoulder Left  12/27/2015  CLINICAL DATA:  Acute left shoulder pain since this morning. No injury. EXAM: LEFT SHOULDER - 2+ VIEW COMPARISON:  None. FINDINGS: There is no evidence of fracture or dislocation. There is no evidence of arthropathy or other focal bone abnormality. Soft tissues are unremarkable. IMPRESSION: Negative. Electronically Signed   By: Elberta Fortisaniel  Boyle M.D.   On: 12/27/2015 08:07   I have personally reviewed and evaluated these images and lab results as part of my medical decision-making.   EKG Interpretation   Date/Time:  Tuesday December 27 2015 07:38:52 EDT Ventricular Rate:  62 PR Interval:    QRS Duration: 92 QT Interval:  405 QTC Calculation: 412 R Axis:   94 Text Interpretation:  Sinus rhythm Borderline right axis deviation  Confirmed by ZAVITZ MD, JOSHUA [id number]) on 12/27/2015 8:39:09 AM      MDM   Final diagnoses:  Shoulder pain, acute, left   Pt presented with acute L shoulder pain. Improved with flexeril, supporting MSK origin. EKG without ST changes. Shoulder Xray without signs of dislocation or fracture. Prescribed flexeril and naproxen. Placed referral to Sports Medicine, and advised patient to call for appointment if symptoms did not improve.  Dani GobbleHillary Fitzgerald, MD [facility], PGY-2    Casey BurkittHillary Moen Fitzgerald, MD 12/28/15 2111  Blane OharaJoshua Zavitz, MD 12/31/15 0000

## 2015-12-27 NOTE — ED Notes (Signed)
Pt reports left shoulder pain since this morning when he woke up after sleeping on the couch. Pt c/o left shoulder pain when raising arm, non-tender to touch. 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 Also has carpal tunnel and lumbar disc disease.

## 2017-03-12 IMAGING — DX DG SHOULDER 2+V*L*
3 series · 3 of 3 positions shown · non-contrast
Comparison: None.

CLINICAL DATA: Acute left shoulder pain since this morning. No
injury.

EXAM:
LEFT SHOULDER - 2+ VIEW

[w shoulder internal left]
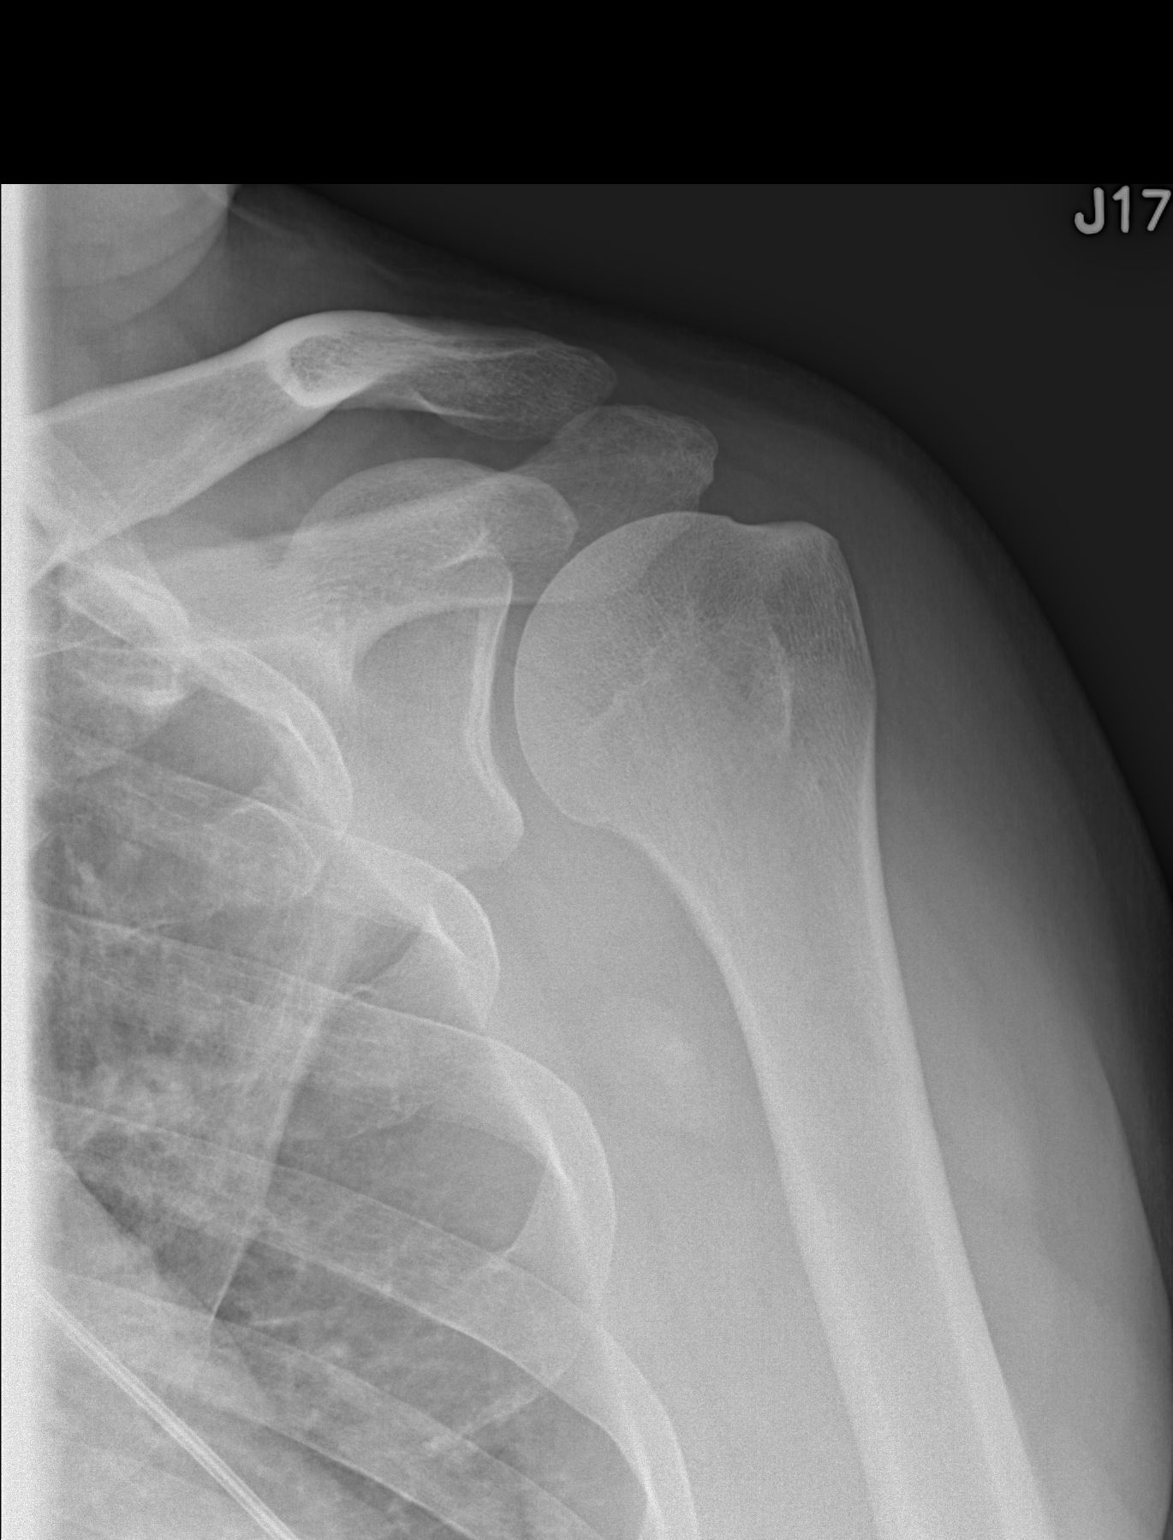

[w shoulder y-view left]
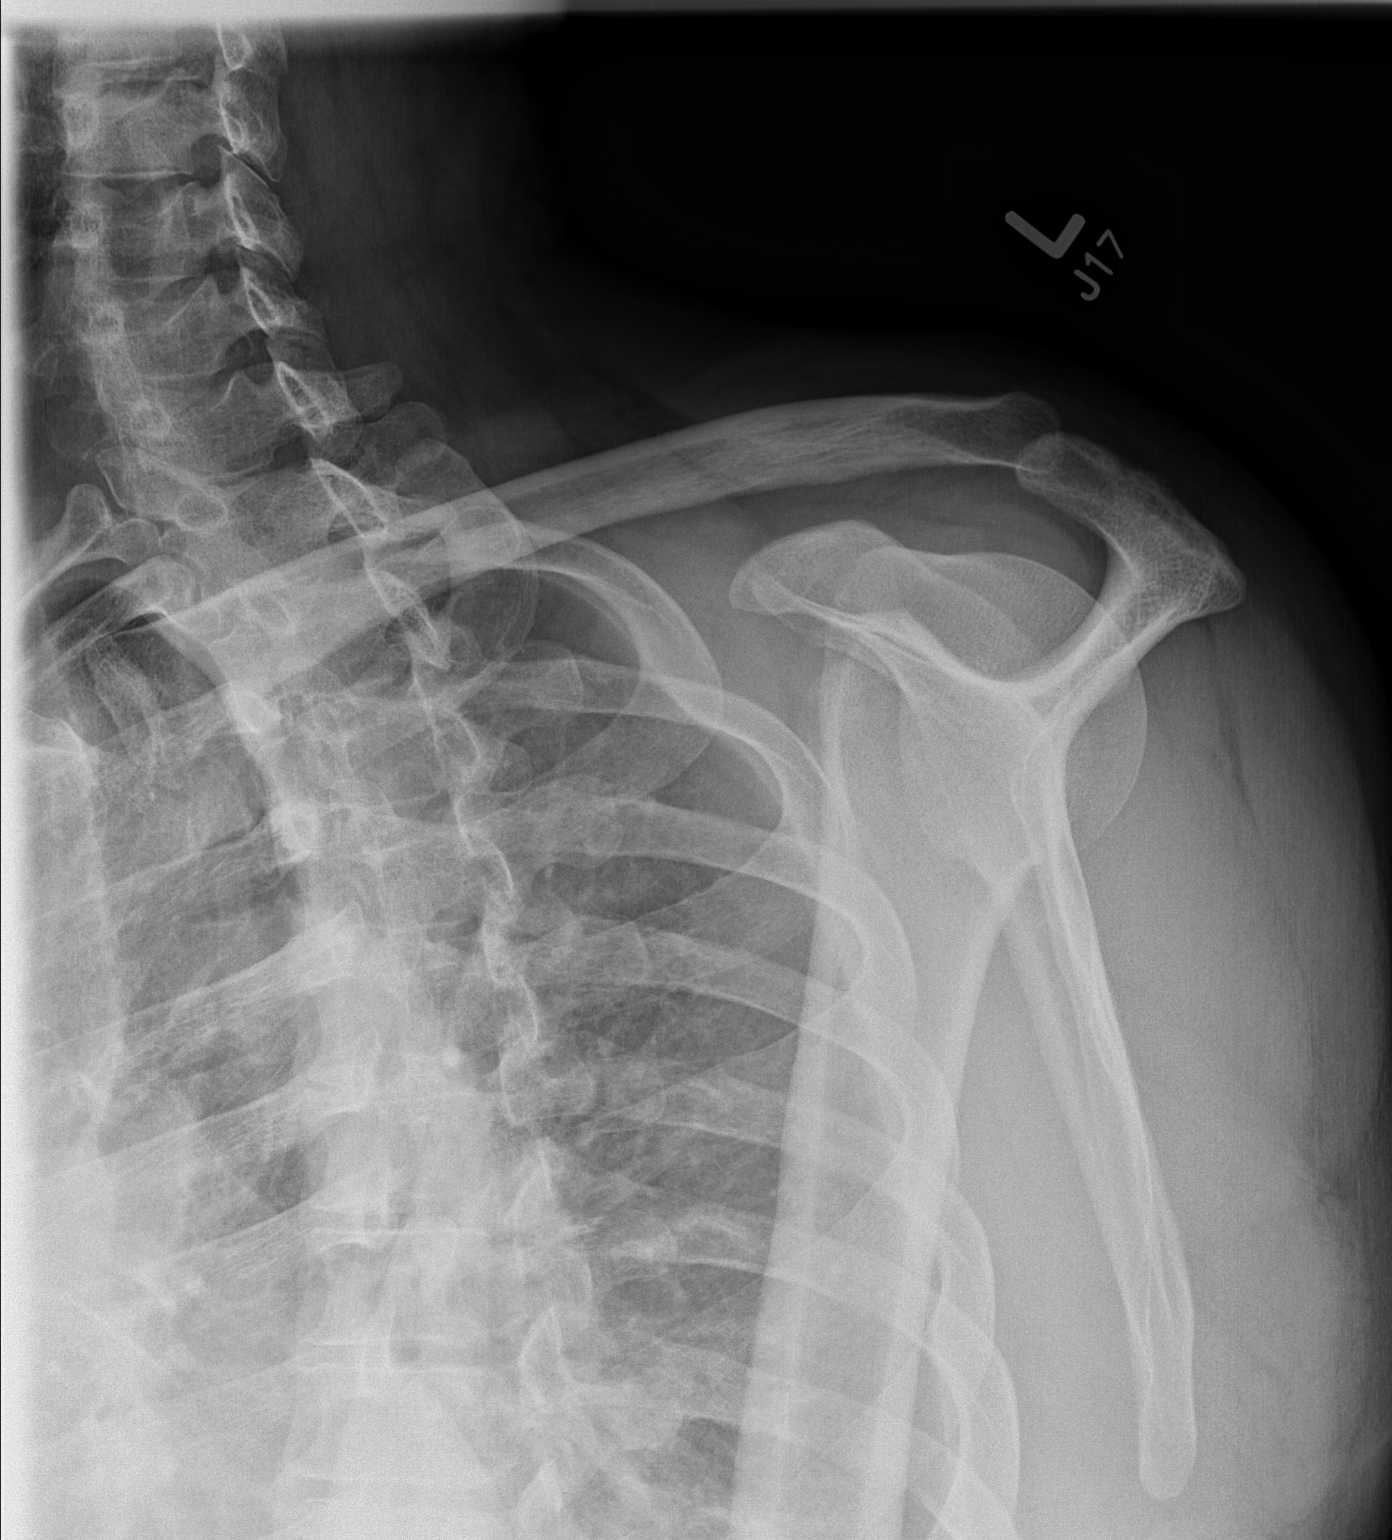

[x shoulder axillary left]
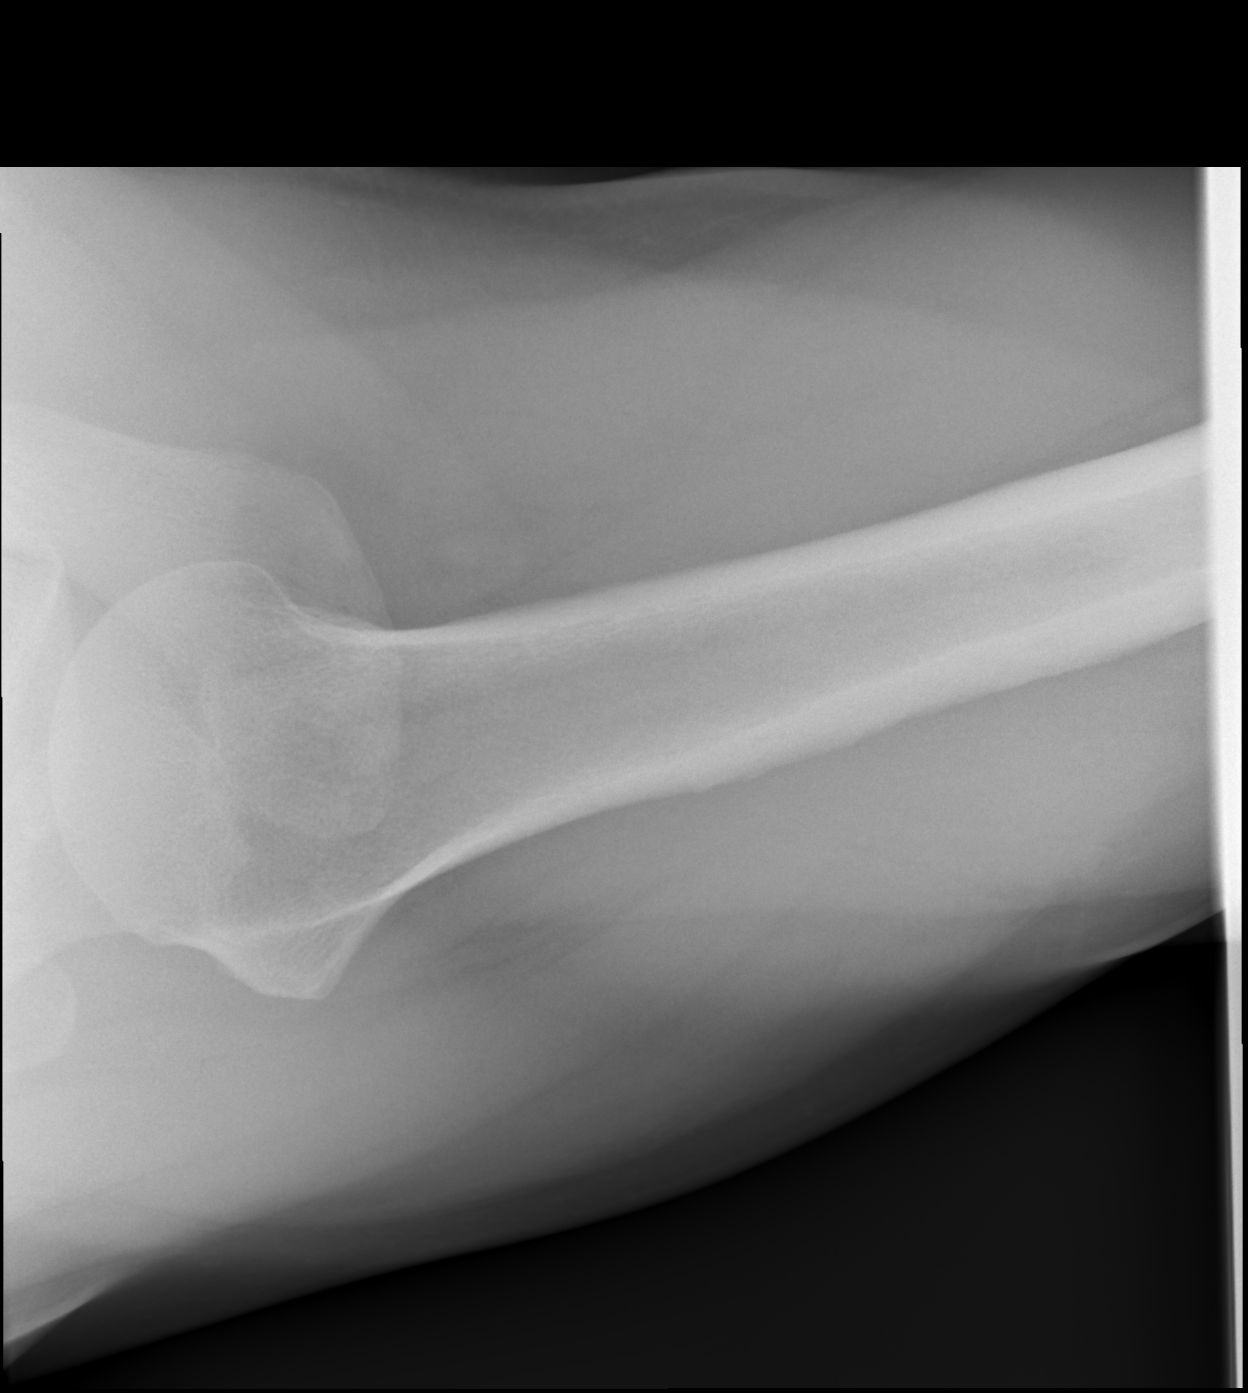

[3 of 3 positions shown; findings below may reference images not displayed]

FINDINGS: There is no evidence of fracture or dislocation. There is no
evidence of arthropathy or other focal bone abnormality. Soft
tissues are unremarkable.
IMPRESSION: Negative.

## 2018-04-26 ENCOUNTER — Other Ambulatory Visit: Payer: Self-pay

## 2018-04-26 ENCOUNTER — Ambulatory Visit (HOSPITAL_COMMUNITY)
Admission: EM | Admit: 2018-04-26 | Discharge: 2018-04-26 | Disposition: A | Discharge status: Home / Self Care | Payer: Self-pay | Attending: Radiology | Admitting: Radiology

## 2018-04-26 ENCOUNTER — Encounter (HOSPITAL_COMMUNITY): Payer: Self-pay

## 2018-04-26 DIAGNOSIS — K0889 Other specified disorders of teeth and supporting structures: Secondary | ICD-10-CM

## 2018-04-26 MED ORDER — KETOROLAC TROMETHAMINE 30 MG/ML IJ SOLN
INTRAMUSCULAR | Status: AC
  Filled 2018-04-26: qty 1

## 2018-04-26 MED ORDER — KETOROLAC TROMETHAMINE 30 MG/ML IJ SOLN
30.0000 mg | Freq: Once | INTRAMUSCULAR | Status: AC
  Administered 2018-04-26: 30 mg via INTRAVENOUS

## 2018-04-26 MED ORDER — AMOXICILLIN 500 MG PO TABS: 500.0000 mg | ORAL_TABLET | Freq: Two times a day (BID) | ORAL | 0 refills | Status: AC

## 2018-04-26 NOTE — ED Provider Notes (Addendum)
[facility]    CSN: [id number] Arrival date & time: 04/26/18  1120     History   Chief Complaint Chief Complaint  Patient presents with  . Dental Pain    HPI Jon Hodges is a 52 y.o. male.   52 year old male presents with dental pain x2 days.  Patient states that he is recently seen a dentist and diagnosed with gum disease.  Patient states that he needs to have all but for teeth removed however he is unable to afford it at this time.  Patient denies any injury or trauma and states that tooth, but reports increased of pain x2 days condition is acute on chronic in nature condition is made better by nothing.  Condition is made worse by eating.  Patient denies any relief from Septra and ibuprofen taken.  She denies any facial pain swelling or fever     Past Medical History:  Diagnosis Date  . Carpal tunnel syndrome     There are no active problems to display for this patient.   Past Surgical History:  Procedure Laterality Date  . FOOT SURGERY    . KNEE ARTHROSCOPY    . KNEE SURGERY    . WRIST SURGERY         Home Medications    Prior to Admission medications   Medication Sig Start Date End Date Taking? Authorizing Provider  Ascorbic Acid (VITAMIN C) 1000 MG tablet Take 1,000 mg by mouth daily.    [provider]  cyclobenzaprine (FLEXERIL) 5 MG tablet Take 1 tablet (5 mg total) by mouth 3 (three) times daily as needed for muscle spasms. 12/27/15   Casey BurkittFitzgerald, Hillary Moen, MD  diazepam (VALIUM) 5 MG tablet Take 5 mg by mouth every 12 (twelve) hours as needed for anxiety.    [provider]  DULoxetine (CYMBALTA) 30 MG capsule Take 30 mg by mouth daily. 11/30/15   [provider]  gabapentin (NEURONTIN) 300 MG capsule Take 300 mg by mouth at bedtime. 11/30/15   [provider]  hydrocortisone cream 0.5 % Apply 1 application topically 2 (two) times daily as needed for itching.    [provider]  ibuprofen  (ADVIL,MOTRIN) 800 MG tablet Take 800 mg by mouth every 8 (eight) hours as needed (pain).    [provider]  naproxen (NAPROSYN) 500 MG tablet Take 1 tablet with meals as needed for pain up to 2 times daily. 12/27/15   Casey BurkittFitzgerald, Hillary Moen, MD  Omega-3 Fatty Acids (OMEGA-3 FISH OIL PO) Take 1 tablet by mouth daily.    [provider]    Family History History reviewed. No pertinent family history.  Social History Social History   Tobacco Use  . Smoking status: Current Every Day Smoker    Packs/day: 1.00  . Smokeless tobacco: Never Used  Substance Use Topics  . Alcohol use: Yes    Comment: social  . Drug use: Not on file     Allergies   Patient has no known allergies.   Review of Systems Review of Systems  Constitutional: Negative for chills and fever.  HENT: Negative for ear pain and sore throat.        Dental pain top right molnar  Eyes: Negative for pain and visual disturbance.  Respiratory: Negative for cough and shortness of breath.   Cardiovascular: Negative for chest pain and palpitations.  Gastrointestinal: Negative for abdominal pain and vomiting.  Genitourinary: Negative for dysuria and hematuria.  Musculoskeletal: Negative for arthralgias  and back pain.  Skin: Negative for color change and rash.  Neurological: Negative for seizures and syncope.  All other systems reviewed and are negative.    Physical Exam Triage Vital Signs ED Triage Vitals  Enc Vitals Group     BP 04/26/18 1247 132/79     Pulse Rate 04/26/18 1247 (!) 18     Resp 04/26/18 1247 18     Temp 04/26/18 1247 98.5 F (36.9 C)     Temp Source 04/26/18 1247 Oral     SpO2 04/26/18 1247 94 %     Weight --      Height --      Head Circumference --      Peak Flow --      Pain Score 04/26/18 1248 8     Pain Loc --      Pain Edu? --      Excl. in GC? --    No data found.  Updated Vital Signs BP 132/79 (BP Location: Right Arm)   Pulse (!) 18   Temp 98 F (36.7 C)    Resp 18   SpO2 94%   Visual Acuity Right Eye Distance:   Left Eye Distance:   Bilateral Distance:    Right Eye Near:   Left Eye Near:    Bilateral Near:     Physical Exam  Constitutional: He is oriented to person, place, and time. He appears well-developed and well-nourished.  HENT:  Head: Normocephalic.  Neck: Normal range of motion.  Pulmonary/Chest: Effort normal.  Musculoskeletal: Normal range of motion.  Neurological: He is alert and oriented to person, place, and time.  Skin: Skin is dry.  Psychiatric: He has a normal mood and affect.  Nursing note and vitals reviewed.    UC Treatments / Results  Labs (all labs ordered are listed, but only abnormal results are displayed) Labs Reviewed - No data to display  EKG None  Radiology No results found.  Procedures Procedures (including critical care time)  Medications Ordered in UC Medications - No data to display  Initial Impression / Assessment and Plan / UC Course  I have reviewed the triage vital signs and the nursing notes.  Pertinent labs & imaging results that were available during my care of the patient were reviewed by me and considered in my medical decision making (see chart for details).      Final Clinical Impressions(s) / UC Diagnoses   Final diagnoses:  None   Discharge Instructions   None    ED Prescriptions    None     Controlled Substance Prescriptions [place] Controlled Substance Registry consulted? Not Applicable   Alene Mires, NP 04/26/18 1313    Alene Mires, NP 04/26/18 1314

## 2018-04-26 NOTE — ED Triage Notes (Signed)
Pt c/o dental pain. X 2 weeks

## 2018-04-27 ENCOUNTER — Other Ambulatory Visit: Payer: Self-pay

## 2018-04-27 ENCOUNTER — Encounter (HOSPITAL_COMMUNITY): Payer: Self-pay | Admitting: Emergency Medicine

## 2018-04-27 ENCOUNTER — Emergency Department (HOSPITAL_COMMUNITY)
Admission: EM | Admit: 2018-04-27 | Discharge: 2018-04-27 | Disposition: A | Discharge status: Home / Self Care | Payer: Medicaid Other | Attending: Emergency Medicine | Admitting: Emergency Medicine

## 2018-04-27 DIAGNOSIS — Z79899 Other long term (current) drug therapy: Secondary | ICD-10-CM | POA: Insufficient documentation

## 2018-04-27 DIAGNOSIS — K0889 Other specified disorders of teeth and supporting structures: Secondary | ICD-10-CM | POA: Insufficient documentation

## 2018-04-27 DIAGNOSIS — F17200 Nicotine dependence, unspecified, uncomplicated: Secondary | ICD-10-CM | POA: Insufficient documentation

## 2018-04-27 NOTE — ED Triage Notes (Signed)
Pt has had dental pain for 2 weeks. Not sleeping. Pt denies fever or chills but endorses swelling and inability to chew on that side. Did see a dentist recently. Was seen at urgent care but "shot didn't last but a couple hours"

## 2018-04-27 NOTE — ED Provider Notes (Signed)
[facility] EMERGENCY DEPARTMENT Provider Note   CSN: [id number] Arrival date & time: 04/27/18  0045     History   Chief Complaint Chief Complaint  Patient presents with  . Dental Pain    HPI Jon Hodges is a 52 y.o. male.  Patient presents to the ED with a chief complaint of dental pain.  States that he has had pain for the past 2 weeks.  Has a dentist and knows that he has to have 12 teeth pulled.  Has tried OTC meds with no relief.  Taking amox.  Can't sleep due to pain.  The history is provided by the patient. No language interpreter was used.    Past Medical History:  Diagnosis Date  . Carpal tunnel syndrome     There are no active problems to display for this patient.   Past Surgical History:  Procedure Laterality Date  . FOOT SURGERY    . KNEE ARTHROSCOPY    . KNEE SURGERY    . WRIST SURGERY          Home Medications    Prior to Admission medications   Medication Sig Start Date End Date Taking? Authorizing Provider  amoxicillin (AMOXIL) 500 MG tablet Take 1 tablet (500 mg total) by mouth 2 (two) times daily for 10 days. 04/26/18 05/06/18  Alene Mires, NP  Ascorbic Acid (VITAMIN C) 1000 MG tablet Take 1,000 mg by mouth daily.    [provider]  cyclobenzaprine (FLEXERIL) 5 MG tablet Take 1 tablet (5 mg total) by mouth 3 (three) times daily as needed for muscle spasms. 12/27/15   Casey Burkitt, MD  diazepam (VALIUM) 5 MG tablet Take 5 mg by mouth every 12 (twelve) hours as needed for anxiety.    [provider]  DULoxetine (CYMBALTA) 30 MG capsule Take 30 mg by mouth daily. 11/30/15   [provider]  gabapentin (NEURONTIN) 300 MG capsule Take 300 mg by mouth at bedtime. 11/30/15   [provider]  hydrocortisone cream 0.5 % Apply 1 application topically 2 (two) times daily as needed for itching.    [provider]  ibuprofen (ADVIL,MOTRIN) 800 MG tablet Take 800 mg by  mouth every 8 (eight) hours as needed (pain).    [provider]  naproxen (NAPROSYN) 500 MG tablet Take 1 tablet with meals as needed for pain up to 2 times daily. 12/27/15   Casey Burkitt, MD  Omega-3 Fatty Acids (OMEGA-3 FISH OIL PO) Take 1 tablet by mouth daily.    [provider]    Family History No family history on file.  Social History Social History   Tobacco Use  . Smoking status: Current Every Day Smoker    Packs/day: 1.00  . Smokeless tobacco: Never Used  Substance Use Topics  . Alcohol use: Yes    Comment: social  . Drug use: Not on file     Allergies   Patient has no known allergies.   Review of Systems Review of Systems  All other systems reviewed and are negative.    Physical Exam Updated Vital Signs BP (!) 161/85   Pulse 75   Temp 97.8 F (36.6 C) (Oral)   SpO2 96%   Physical Exam Physical Exam  Constitutional: Pt appears well-developed and well-nourished.  HENT:  Head: Normocephalic.  Right Ear: Tympanic membrane, external ear and ear canal normal.  Left Ear: Tympanic membrane, external ear and ear canal normal.  Nose: Nose normal.  Right sinus exhibits no maxillary sinus tenderness and no frontal sinus tenderness. Left sinus exhibits no maxillary sinus tenderness and no frontal sinus tenderness.  Mouth/Throat: Uvula is midline, oropharynx is clear and moist and mucous membranes are normal. No oral lesions. No uvula swelling or lacerations. No oropharyngeal exudate, posterior oropharyngeal edema, posterior oropharyngeal erythema or tonsillar abscesses.  Poor dentition No gingival swelling, fluctuance or induration No gross abscess  No sublingual edema, tenderness to palpation, or sign of Ludwig's angina, or deep space infection Pain at right upper rear molar Eyes: Conjunctivae are normal. Pupils are equal, round, and reactive to light. Right eye exhibits no discharge. Left eye exhibits no discharge.  Neck: Normal  range of motion. Neck supple.  No stridor Handling secretions without difficulty No nuchal rigidity No cervical lymphadenopathy Cardiovascular: Normal rate, regular rhythm and normal heart sounds.   Pulmonary/Chest: Effort normal. No respiratory distress.  Equal chest rise  Abdominal: Soft. Bowel sounds are normal. Pt exhibits no distension. There is no tenderness.  Lymphadenopathy: Pt has no cervical adenopathy.  Neurological: Pt is alert and oriented x 4  Skin: Skin is warm and dry.  Psychiatric: Pt has a normal mood and affect.  Nursing note and vitals reviewed.    ED Treatments / Results  Labs (all labs ordered are listed, but only abnormal results are displayed) Labs Reviewed - No data to display  EKG None  Radiology No results found.  Procedures Dental Block Date/Time: 04/27/2018 1:55 AM Performed by: Roxy HorsemanBrowning, Treyshaun Keatts, PA-C Authorized by: Roxy HorsemanBrowning, Stanton Kissoon, PA-C   Consent:    Consent obtained:  Verbal   Consent given by:  Patient   Risks discussed:  Allergic reaction, unsuccessful block and pain   Alternatives discussed:  No treatment Indications:    Indications: dental pain   Location:    Block type:  Supraperiosteal Procedure details (see MAR for exact dosages):    Topical anesthetic:  Benzocaine gel   Needle gauge:  27 G   Anesthetic injected:  Bupivacaine 0.25% WITH epi   Injection procedure:  Anatomic landmarks identified, introduced needle, anatomic landmarks palpated and incremental injection Post-procedure details:    Outcome:  Anesthesia achieved   Patient tolerance of procedure:  Tolerated well, no immediate complications   (including critical care time)   Medications Ordered in ED Medications - No data to display   Initial Impression / Assessment and Plan / ED Course  I have reviewed the triage vital signs and the nursing notes.  Pertinent labs & imaging results that were available during my care of the patient were reviewed by me and  considered in my medical decision making (see chart for details).     Patient with dentalgia.  No abscess requiring immediate incision and drainage.  Exam not concerning for Ludwig's angina or pharyngeal abscess.  Will treat with dental block. Pt instructed to follow-up with dentist.  Discussed return precautions. Pt safe for discharge.   Final Clinical Impressions(s) / ED Diagnoses   Final diagnoses:  Pain, dental    ED Discharge Orders    None       Roxy HorsemanBrowning, Latiqua Daloia, PA-C 04/27/18 [id number]    Mesner, Barbara CowerJason, MD 04/27/18 [phone]

## 2021-07-18 ENCOUNTER — Other Ambulatory Visit: Payer: Self-pay | Admitting: Gastroenterology

## 2021-11-08 ENCOUNTER — Encounter (HOSPITAL_COMMUNITY): Payer: Self-pay | Admitting: Gastroenterology

## 2021-11-14 ENCOUNTER — Ambulatory Visit (HOSPITAL_COMMUNITY): Admit: 2021-11-14 | Payer: Self-pay | Admitting: Gastroenterology

## 2021-11-14 SURGERY — COLONOSCOPY WITH PROPOFOL
Anesthesia: Monitor Anesthesia Care

## 2023-08-05 LAB — LAB REPORT - SCANNED: EGFR: 79

## 2023-09-20 ENCOUNTER — Other Ambulatory Visit: Payer: Self-pay | Admitting: Orthopaedic Surgery

## 2023-10-08 NOTE — Progress Notes (Signed)
 COVID Vaccine Completed:  Date of COVID positive in last 90 days:  [facility]  Cardiologist - patient is awaiting referral.  Chest x-ray - n/a EKG - n/a Stress Test - n/a ECHO - n/a Cardiac Cath - n/a Pacemaker/ICD device last checked: n/a Spinal Cord Stimulator: n/a  Bowel Prep - no  Sleep Study - n/a CPAP -   Fasting Blood Sugar - n/a Checks Blood Sugar _____ times a day  Last dose of GLP1 agonist-  N/A GLP1 instructions:  Hold 7 days before surgery    Last dose of SGLT-2 inhibitors-  N/A SGLT-2 instructions:  Hold 3 days before surgery    Blood Thinner Instructions:  Last dose:  N/a Time: Aspirin Instructions: Last Dose:  Activity level: Can go up a flight of stairs and perform activities of daily living without stopping and without symptoms of chest pain or shortness of breath.   Anesthesia review: Patient was supposed to have colonoscopy 10/04/23. Was cancelled due to 'irregular heart rate'. Per pt heart rate was 35, he denied any symptoms at the time or currently. Needs cardiology referral and clearance   Patient denies shortness of breath, fever, cough and chest pain at PAT appointment  Patient verbalized understanding of instructions that were given to them at the PAT appointment. Patient was also instructed that they will need to review over the PAT instructions again at home before surgery.

## 2023-10-09 ENCOUNTER — Encounter (HOSPITAL_COMMUNITY): Payer: Self-pay

## 2023-10-09 ENCOUNTER — Other Ambulatory Visit: Payer: Self-pay

## 2023-10-09 ENCOUNTER — Encounter (HOSPITAL_COMMUNITY)
Admission: RE | Admit: 2023-10-09 | Discharge: 2023-10-09 | Disposition: A | Discharge status: Home / Self Care | Payer: MEDICAID | Source: Ambulatory Visit | Attending: Orthopaedic Surgery | Admitting: Orthopaedic Surgery

## 2023-10-09 ENCOUNTER — Encounter (HOSPITAL_COMMUNITY): Payer: Self-pay | Admitting: Physician Assistant

## 2023-10-09 VITALS — Ht 70.0 in | Wt 190.0 lb

## 2023-10-09 DIAGNOSIS — Z01818 Encounter for other preprocedural examination: Secondary | ICD-10-CM

## 2023-10-09 NOTE — Patient Instructions (Addendum)
 SURGICAL WAITING ROOM VISITATION  Patients having surgery or a procedure may have no more than 2 support people in the waiting area - these visitors may rotate.    Children under the age of 67 must have an adult with them who is not the patient.  Due to an increase in RSV and influenza rates and associated hospitalizations, children ages 28 and under may not visit patients in [facility] -  hospitals.  Visitors with respiratory illnesses are discouraged from visiting and should remain at home.  If the patient needs to stay at the hospital during part of their recovery, the visitor guidelines for inpatient rooms apply. Pre-op nurse will coordinate an appropriate time for 1 support person to accompany patient in pre-op.  This support person may not rotate.    Please refer to the [facility] website for the visitor guidelines for Inpatients (after your surgery is over and you are in a regular room).    Your procedure is scheduled on: 10/15/23   Report to [facility] Main Entrance    Report to admitting at 9:25 AM   Call this number if you have problems the morning of surgery [phone]   Do not eat food :After Midnight.   After Midnight you may have the following liquids until 8:40 AM DAY OF SURGERY  Water Non-Citrus Juices (without pulp, NO RED-Apple, White grape, White cranberry) Black Coffee (NO MILK/CREAM OR CREAMERS, sugar ok)  Clear Tea (NO MILK/CREAM OR CREAMERS, sugar ok) regular and decaf                             Plain Jell-O (NO RED)                                           Fruit ices (not with fruit pulp, NO RED)                                     Popsicles (NO RED)                                                               Sports drinks like Gatorade (NO RED)          If you have questions, please contact your surgeon's office.   FOLLOW BOWEL PREP AND ANY ADDITIONAL PRE OP INSTRUCTIONS YOU RECEIVED FROM YOUR SURGEON'S OFFICE!!!     Oral Hygiene is also  important to reduce your risk of infection.                                    Remember - BRUSH YOUR TEETH THE MORNING OF SURGERY WITH YOUR REGULAR TOOTHPASTE  DENTURES WILL BE REMOVED PRIOR TO SURGERY PLEASE DO NOT APPLY "Poly grip" OR ADHESIVES!!!   Do NOT smoke after Midnight   Stop all vitamins and herbal supplements 7 days before surgery.   Take these medicines the morning of surgery with A SIP OF WATER: ask your doctor about Methadone  You may not have any metal on your body including jewelry, and body piercing             Do not wear lotions, powders, cologne, or deodorant              Men may shave face and neck.   Do not bring valuables to the hospital. [facility] IS NOT             RESPONSIBLE   FOR VALUABLES.   Contacts, glasses, dentures or bridgework may not be worn into surgery.  DO NOT BRING YOUR HOME MEDICATIONS TO THE HOSPITAL. PHARMACY WILL DISPENSE MEDICATIONS LISTED ON YOUR MEDICATION LIST TO YOU DURING YOUR ADMISSION IN THE HOSPITAL!    Patients discharged on the day of surgery will not be allowed to drive home.  Someone NEEDS to stay with you for the first 24 hours after anesthesia.              Please read over the following fact sheets you were given: IF YOU HAVE QUESTIONS ABOUT YOUR PRE-OP INSTRUCTIONS PLEASE CALL [name]    If you received a COVID test during your pre-op visit  it is requested that you wear a mask when out in public, stay away from anyone that may not be feeling well and notify your surgeon if you develop symptoms. If you test positive for Covid or have been in contact with anyone that has tested positive in the last 10 days please notify you surgeon.  [place] - Preparing for Surgery Before surgery, you can play an important role.  Because skin is not sterile, your skin needs to be as free of germs as possible.  You can reduce the number of germs on your skin by washing with CHG (chlorahexidine  gluconate) soap before surgery.  CHG is an antiseptic cleaner which kills germs and bonds with the skin to continue killing germs even after washing. Please DO NOT use if you have an allergy to CHG or antibacterial soaps.  If your skin becomes reddened/irritated stop using the CHG and inform your nurse when you arrive at Short Stay. Do not shave (including legs and underarms) for at least 48 hours prior to the first CHG shower.  You may shave your face/neck.  Please follow these instructions carefully:  1.  Shower with CHG Soap the night before surgery and the  morning of surgery.  2.  If you choose to wash your hair, wash your hair first as usual with your normal  shampoo.  3.  After you shampoo, rinse your hair and body thoroughly to remove the shampoo.                             4.  Use CHG as you would any other liquid soap.  You can apply chg directly to the skin and wash.  Gently with a scrungie or clean washcloth.  5.  Apply the CHG Soap to your body ONLY FROM THE NECK DOWN.   Do   not use on face/ open                           Wound or open sores. Avoid contact with eyes, ears mouth and   genitals (private parts).                       Wash face,  Genitals (private parts) with your normal soap.             6.  Wash thoroughly, paying special attention to the area where your    surgery  will be performed.  7.  Thoroughly rinse your body with warm water from the neck down.  8.  DO NOT shower/wash with your normal soap after using and rinsing off the CHG Soap.                9.  Pat yourself dry with a clean towel.            10.  Wear clean pajamas.            11.  Place clean sheets on your bed the night of your first shower and do not  sleep with pets. Day of Surgery : Do not apply any lotions/deodorants the morning of surgery.  Please wear clean clothes to the hospital/surgery center.  FAILURE TO FOLLOW THESE INSTRUCTIONS MAY RESULT IN THE CANCELLATION OF YOUR SURGERY  PATIENT  SIGNATURE_________________________________  NURSE SIGNATURE__________________________________  ________________________________________________________________________  Jon Hodges  An incentive spirometer is a tool that can help keep your lungs clear and active. This tool measures how well you are filling your lungs with each breath. Taking long deep breaths may help reverse or decrease the chance of developing breathing (pulmonary) problems (especially infection) following: A long period of time when you are unable to move or be active. BEFORE THE PROCEDURE  If the spirometer includes an indicator to show your best effort, your nurse or respiratory therapist will set it to a desired goal. If possible, sit up straight or lean slightly forward. Try not to slouch. Hold the incentive spirometer in an upright position. INSTRUCTIONS FOR USE  Sit on the edge of your bed if possible, or sit up as far as you can in bed or on a chair. Hold the incentive spirometer in an upright position. Breathe out normally. Place the mouthpiece in your mouth and seal your lips tightly around it. Breathe in slowly and as deeply as possible, raising the piston or the ball toward the top of the column. Hold your breath for 3-5 seconds or for as long as possible. Allow the piston or ball to fall to the bottom of the column. Remove the mouthpiece from your mouth and breathe out normally. Rest for a few seconds and repeat Steps 1 through 7 at least 10 times every 1-2 hours when you are awake. Take your time and take a few normal breaths between deep breaths. The spirometer may include an indicator to show your best effort. Use the indicator as a goal to work toward during each repetition. After each set of 10 deep breaths, practice coughing to be sure your lungs are clear. If you have an incision (the cut made at the time of surgery), support your incision when coughing by placing a pillow or rolled up towels  firmly against it. Once you are able to get out of bed, walk around indoors and cough well. You may stop using the incentive spirometer when instructed by your caregiver.  RISKS AND COMPLICATIONS Take your time so you do not get dizzy or light-headed. If you are in pain, you may need to take or ask for pain medication before doing incentive spirometry. It is harder to take a deep breath if you are having pain. AFTER USE Rest and breathe slowly and easily. It can be helpful to keep track of  a log of your progress. Your caregiver can provide you with a simple table to help with this. If you are using the spirometer at home, follow these instructions: SEEK MEDICAL CARE IF:  You are having difficultly using the spirometer. You have trouble using the spirometer as often as instructed. Your pain medication is not giving enough relief while using the spirometer. You develop fever of 100.5 F (38.1 C) or higher. SEEK IMMEDIATE MEDICAL CARE IF:  You cough up bloody sputum that had not been present before. You develop fever of 102 F (38.9 C) or greater. You develop worsening pain at or near the incision site. MAKE SURE YOU:  Understand these instructions. Will watch your condition. Will get help right away if you are not doing well or get worse. Document Released: 10/08/2006 Document Revised: 08/20/2011 Document Reviewed: 12/09/2006 [facility] Patient Information 2014 [place], [place].   ________________________________________________________________________

## 2023-10-11 ENCOUNTER — Encounter (HOSPITAL_COMMUNITY): Payer: MEDICAID

## 2023-10-15 ENCOUNTER — Encounter (HOSPITAL_COMMUNITY): Admission: RE | Payer: Self-pay | Source: Home / Self Care

## 2023-10-15 ENCOUNTER — Ambulatory Visit (HOSPITAL_COMMUNITY): Admission: RE | Admit: 2023-10-15 | Payer: MEDICAID | Source: Home / Self Care | Admitting: Orthopaedic Surgery

## 2023-10-15 SURGERY — CARPOMETACARPEL (CMC) SUSPENSION PLASTY
Anesthesia: General | Laterality: Left

## 2023-10-21 ENCOUNTER — Ambulatory Visit: Attending: Cardiology

## 2023-10-21 ENCOUNTER — Ambulatory Visit: Payer: MEDICAID | Attending: Cardiology | Admitting: Cardiology

## 2023-10-21 ENCOUNTER — Encounter: Payer: Self-pay | Admitting: Cardiology

## 2023-10-21 VITALS — BP 164/90 | HR 70 | Resp 16 | Ht 70.0 in | Wt 198.2 lb

## 2023-10-21 DIAGNOSIS — I10 Essential (primary) hypertension: Secondary | ICD-10-CM | POA: Diagnosis present

## 2023-10-21 DIAGNOSIS — E782 Mixed hyperlipidemia: Secondary | ICD-10-CM

## 2023-10-21 DIAGNOSIS — R0681 Apnea, not elsewhere classified: Secondary | ICD-10-CM | POA: Diagnosis present

## 2023-10-21 DIAGNOSIS — I499 Cardiac arrhythmia, unspecified: Secondary | ICD-10-CM

## 2023-10-21 DIAGNOSIS — I493 Ventricular premature depolarization: Secondary | ICD-10-CM | POA: Diagnosis not present

## 2023-10-21 DIAGNOSIS — F17210 Nicotine dependence, cigarettes, uncomplicated: Secondary | ICD-10-CM

## 2023-10-21 DIAGNOSIS — R0683 Snoring: Secondary | ICD-10-CM | POA: Diagnosis not present

## 2023-10-21 MED ORDER — METOPROLOL SUCCINATE ER 25 MG PO TB24: 25.0000 mg | ORAL_TABLET | Freq: Every day | ORAL | 2 refills | Status: DC

## 2023-10-21 MED ORDER — ATORVASTATIN CALCIUM 40 MG PO TABS: 40.0000 mg | ORAL_TABLET | Freq: Every day | ORAL | 3 refills | Status: AC

## 2023-10-21 MED ORDER — NICOTINE 14 MG/24HR TD PT24: 14.0000 mg | MEDICATED_PATCH | Freq: Every day | TRANSDERMAL | 3 refills | Status: AC

## 2023-10-21 NOTE — Progress Notes (Signed)
 Cardiology Office Note:  .   Date:  10/21/2023  ID:  Jon Hodges, DOB February 06, 1966, MRN [id number] PCP: Jon Grounds, DO  [facility] Providers Cardiologist:  Jon Ivy, MD PCP: Jon Grounds, DO  Chief Complaint  Patient presents with   Irregular heartbeat   New Patient (Initial Visit)     Jon Hodges is a 58 y.o. male with hypertension, hyperlipidemia, nicotine dependence, referred for irregular heartbeat  Discussed the use of AI scribe software for clinical note transcription with the patient, who gave verbal consent to proceed.  History of Present Illness Jon Hodges is a 58 year old male who presents with an irregular heart rate discovered during a routine colonoscopy screening.  He exhibits Jon symptoms of palpitations, chest pain, dyspnea, peripheral edema, lightheadedness, or syncope. He smokes one pack of cigarettes daily and has resumed consuming one Monster energy drink per day after previously consuming four to five daily over three years ago. He experiences breath-holding spells during sleep, lasting up to twenty seconds, and occasionally catches himself holding his breath while awake. He recently started taking Lipitor for elevated cholesterol levels.      Vitals:   10/21/23 1432  BP: (!) 164/90  Pulse: 70  Resp: 16  SpO2: 98%      Review of Systems  Cardiovascular:  Negative for chest pain, dyspnea on exertion, leg swelling, palpitations and syncope.  Respiratory:  Positive for snoring.         Studies Reviewed: Jon Hodges        EKG 10/21/2023: Sinus rhythm with frequent Premature ventricular complexes in a pattern of bigeminy When compared with ECG of 27-Dec-2015 07:38,  PVCs are new    Independently interpreted 07/2023: Chol 253, TG 155, HDL 43, LDL 181 HbA1C 5.7%     Physical Exam Vitals and nursing note reviewed.  Constitutional:      General: He is not in acute distress. Neck:     Vascular: Jon JVD.   Cardiovascular:     Rate and Rhythm: Normal rate and regular rhythm. Frequent Extrasystoles are present.    Heart sounds: Normal heart sounds. Jon murmur heard. Pulmonary:     Effort: Pulmonary effort is normal.     Breath sounds: Normal breath sounds. Jon wheezing or rales.  Musculoskeletal:     Right lower leg: Jon edema.     Left lower leg: Jon edema.      VISIT DIAGNOSES:   ICD-10-CM   1. Irregular heartbeat  I49.9 EKG 12-Lead    2. Snoring  R06.83 [facility] Sleep Study    3. PVC (premature ventricular contraction)  I49.3 ECHOCARDIOGRAM COMPLETE    LONG TERM MONITOR (3-14 DAYS)    4. Mixed hyperlipidemia  E78.2 Lipid panel    CT CARDIAC SCORING (SELF PAY ONLY)    Lipid panel    5. Apneic episode  R06.81 [facility] Sleep Study    6. Primary hypertension  I10        Jon Hodges is a 58 y.o. male with hypertension, hyperlipidemia, nicotine dependence, suspected OSA, frequent PVCs/ventricular bigeminy   Assessment & Plan Premature Ventricular Contractions (PVCs) Frequent PVCs with bigeminy pattern on EKG, likely exacerbated by caffeine intake and possibly related to undiagnosed sleep apnea. - Prescribe metoprolol succinate 25 mg daily to reduce PVC burden.   - Order echocardiogram to assess heart function. - Provide one-week Holter monitor to evaluate PVC frequency post-medication. - Advise reduction in energy drink consumption. - Order home sleep study  to confirm sleep apnea diagnosis. - Discuss potential CPAP therapy based on sleep study results.  Mixed hyperlipidemia: Elevated cholesterol levels. Recently started on atorvastatin. Discussed potential coronary artery plaque buildup. - Increase atorvastatin dose to 40 mg. - Recheck cholesterol levels in one month. - Order CT calcium score to assess coronary artery calcification.  Nicotine Dependence: Tobacco cessation counseling:  - Currently smoking 1 packs/day   - Patient was informed of the dangers of tobacco  abuse including stroke, cancer, and MI, as well as benefits of tobacco cessation. - Patient is willing to quit at this time. - Approximately 5 mins were spent counseling patient cessation techniques. We discussed various methods to help quit smoking, including deciding on a date to quit, joining a support group, pharmacological agents. Patient would like to use nicotine patch. - I will reassess his progress at the next follow-up visit  Primary hypertension: Started on metoprolol succinate 50 mg daily for PVCs, hopefully will also reduce blood pressure.  He may likely need additional antihypertensive therapy in future visits.  Preop evaluation: While colonoscopy would likely be low risk, this is only screening colonoscopy without any symptoms.  That said, patient also is hoping to undergo surgery soon.  I would advise waiting until above testing is completed, before proceeding with surgery, especially if it is to be performed under general anesthesia.         Meds ordered this encounter  Medications   atorvastatin (LIPITOR) 40 MG tablet    Sig: Take 1 tablet (40 mg total) by mouth daily.    Dispense:  90 tablet    Refill:  3   metoprolol succinate (TOPROL XL) 25 MG 24 hr tablet    Sig: Take 1 tablet (25 mg total) by mouth daily.    Dispense:  90 tablet    Refill:  2   nicotine (NICODERM CQ - DOSED IN MG/24 HOURS) 14 mg/24hr patch    Sig: Place 1 patch (14 mg total) onto the skin daily.    Dispense:  28 patch    Refill:  3     F/u in 6-8 weeks  Signed, Jon Das, MD

## 2023-10-21 NOTE — Patient Instructions (Addendum)
 Medication Instructions:  Increased atorvastatin to 40 mg daily  START Metoprolol XL 25 MG START Nicotine patch   *If you need a refill on your cardiac medications before your next appointment, please call your pharmacy*  Lab Work: Lipid panel in 4-6 weeks   If you have labs (blood work) drawn today and your tests are completely normal, you will receive your results only by: MyChart Message (if you have MyChart) OR A paper copy in the mail If you have any lab test that is abnormal or we need to change your treatment, we will call you to review the results.  Testing/Procedures: Echo  Your physician has requested that you have an echocardiogram. Echocardiography is a painless test that uses sound waves to create images of your heart. It provides your doctor with information about the size and shape of your heart and how well your heart's chambers and valves are working. This procedure takes approximately one hour. There are no restrictions for this procedure. Please do NOT wear cologne, perfume, aftershave, or lotions (deodorant is allowed). Please arrive 15 minutes prior to your appointment time.  Please note: We ask at that you not bring children with you during ultrasound (echo/ vascular) testing. Due to room size and safety concerns, children are not allowed in the ultrasound rooms during exams. Our front office staff cannot provide observation of children in our lobby area while testing is being conducted. An adult accompanying a patient to their appointment will only be allowed in the ultrasound room at the discretion of the ultrasound technician under special circumstances. We apologize for any inconvenience.   CALCIUM SCORE   CT scanning for a cardiac calcium score (CAT scanning), is a noninvasive, special x-ray that produces cross-sectional images of the body using x-rays and a computer. CT scans help physicians diagnose and treat medical conditions. For some CT exams, a contrast  material is used to enhance visibility in the area of the body being studied. CT scans provide greater clarity and reveal more details than regular x-ray exams.   [facility]   Your physician has recommended that you have a sleep study. This test records several body functions during sleep, including: brain activity, eye movement, oxygen and carbon dioxide blood levels, heart rate and rhythm, breathing rate and rhythm, the flow of air through your mouth and nose, snoring, body muscle movements, and chest and belly movement.  ZIO 7 DAY MONITOR   Your physician has requested that you wear a Zio heart monitor for __7___ days. This will be mailed to your home with instructions on how to apply the monitor and how to return it when finished. Please allow 2 weeks after returning the heart monitor before our office calls you with the results.   Follow-Up: At [facility], you and your health needs are our priority.  As part of our continuing mission to provide you with exceptional heart care, our providers are all part of one team.  This team includes your primary Cardiologist (physician) and Advanced Practice Providers or APPs (Physician Assistants and Nurse Practitioners) who all work together to provide you with the care you need, when you need it.  Your next appointment:   6-8  week(s)  Provider:   APP  We recommend signing up for the patient portal called "MyChart".  Sign up information is provided on this After Visit Summary.  MyChart is used to connect with patients for Virtual Visits (Telemedicine).  Patients are able to view lab/test results, encounter notes,  upcoming appointments, etc.  Non-urgent messages can be sent to your provider as well.   To learn more about what you can do with MyChart, go to [email or web address].   Other Instructions ZIO XT- Long Term Monitor Instructions     Your physician has requested you wear a ZIO patch monitor for 3 days.  This is a single patch  monitor. [employer] supplies one patch monitor per enrollment. Additional  stickers are not available. Please do not apply patch if you will be having a Nuclear Stress Test,  Echocardiogram, Cardiac CT, MRI, or Chest Xray during the period you would be wearing the  monitor. The patch cannot be worn during these tests. You cannot remove and re-apply the  ZIO XT patch monitor.  Your ZIO patch monitor will be mailed 3 day USPS to your address on file. It may take 3-5 days  to receive your monitor after you have been enrolled.  Once you have received your monitor, please review the enclosed instructions. Your monitor  has already been registered assigning a specific monitor serial # to you.     Billing and Patient Assistance Program Information     We have supplied [employer] with any of your insurance information on file for billing purposes.  [employer] offers a sliding scale Patient Assistance Program for patients that do not have  insurance, or whose insurance does not completely cover the cost of the ZIO monitor.  You must apply for the Patient Assistance Program to qualify for this discounted rate.  To apply, please call [employer] at [phone], select option 4, select option 2, ask to apply for  Patient Assistance Program. Sanna Crystal will ask your household income, and how many people  are in your household. They will quote your out-of-pocket cost based on that information.  [employer] will also be able to set up a 66-month, interest-free payment plan if needed.     Applying the monitor     Shave hair from upper left chest.  Hold abrader disc by orange tab. Rub abrader in 40 strokes over the upper left chest as  indicated in your monitor instructions.  Clean area with 4 enclosed alcohol pads. Let dry.  Apply patch as indicated in monitor instructions. Patch will be placed under collarbone on left  side of chest with arrow pointing upward.  Rub patch adhesive wings for 2 minutes. Remove white label  marked "1". Remove the white  label marked "2". Rub patch adhesive wings for 2 additional minutes.  While looking in a mirror, press and release button in center of patch. A small green light will  flash 3-4 times. This will be your only indicator that the monitor has been turned on.  Do not shower for the first 24 hours. You may shower after the first 24 hours.  Press the button if you feel a symptom. You will hear a small click. Record Date, Time and  Symptom in the Patient Logbook.  When you are ready to remove the patch, follow instructions on the last 2 pages of Patient  Logbook. Stick patch monitor onto the last page of Patient Logbook.  Place Patient Logbook in the blue and white box. Use locking tab on box and tape box closed  securely. The blue and white box has prepaid postage on it. Please place it in the mailbox as  soon as possible. Your physician should have your test results approximately 7 days after the  monitor has been mailed back to [facility].  Call [employer] Customer Care at [phone] if you have questions regarding  your ZIO XT patch monitor. Call them immediately if you see an orange light blinking on your  monitor.  If your monitor falls off in less than 4 days, contact our Monitor department at [phone].  If your monitor becomes loose or falls off after 4 days call [employer] at [phone] for  suggestions on securing your monitor.

## 2023-10-21 NOTE — Progress Notes (Unsigned)
 Enrolled for [employer] to mail a ZIO XT long term holter monitor to the patients address on file.

## 2023-10-22 ENCOUNTER — Telehealth: Payer: Self-pay | Admitting: Radiology

## 2023-10-22 NOTE — Telephone Encounter (Signed)
 Patient agreement reviewed and signed on 10/21/23.  WatchPAT issued to patient on 10/21/23 by Thamas Fillers. Patient aware to not open the [facility] box until contacted with the activation PIN. Patient profile initialized in Cloud[employer] on 10/22/2023 by Jenise Mixer. Device serial number: [id number]  Please list Reason for Call as Advice Only and type "WatchPAT issued to patient" in the comment box.

## 2023-11-19 ENCOUNTER — Ambulatory Visit: Payer: Self-pay | Admitting: Cardiology

## 2023-11-19 DIAGNOSIS — I493 Ventricular premature depolarization: Secondary | ICD-10-CM | POA: Diagnosis not present

## 2023-11-19 NOTE — Progress Notes (Signed)
 Few episodes of rapid heart beat noted, no serious arrhythmia seen. Recommend vagal maneuvers. Continue metoprolol .  Thanks MJP

## 2023-11-20 NOTE — Progress Notes (Signed)
 MyChart message containing Dr. Silverio Drought result note and interpretation read by patient on: Last read by [name] at 6:21PM on 11/19/2023.

## 2023-11-22 ENCOUNTER — Ambulatory Visit (HOSPITAL_COMMUNITY)
Admission: RE | Admit: 2023-11-22 | Discharge: 2023-11-22 | Disposition: A | Discharge status: Home / Self Care | Payer: Self-pay | Source: Ambulatory Visit | Attending: Cardiology

## 2023-11-22 DIAGNOSIS — E782 Mixed hyperlipidemia: Secondary | ICD-10-CM | POA: Insufficient documentation

## 2023-11-29 ENCOUNTER — Ambulatory Visit (HOSPITAL_COMMUNITY)
Admission: RE | Admit: 2023-11-29 | Discharge: 2023-11-29 | Disposition: A | Discharge status: Home / Self Care | Payer: MEDICAID | Source: Ambulatory Visit | Attending: Cardiology | Admitting: Cardiology

## 2023-11-29 DIAGNOSIS — I10 Essential (primary) hypertension: Secondary | ICD-10-CM

## 2023-11-29 DIAGNOSIS — I493 Ventricular premature depolarization: Secondary | ICD-10-CM

## 2023-11-29 LAB — ECHOCARDIOGRAM COMPLETE
Area-P 1/2: 3.6 cm2
S' Lateral: 3.2 cm

## 2023-12-04 ENCOUNTER — Encounter (HOSPITAL_BASED_OUTPATIENT_CLINIC_OR_DEPARTMENT_OTHER): Payer: Self-pay | Admitting: *Deleted

## 2023-12-05 ENCOUNTER — Encounter: Payer: Self-pay | Admitting: Physician Assistant

## 2023-12-05 DIAGNOSIS — I2510 Atherosclerotic heart disease of native coronary artery without angina pectoris: Secondary | ICD-10-CM | POA: Insufficient documentation

## 2023-12-05 NOTE — Progress Notes (Signed)
 OFFICE NOTE:    Date:  12/06/2023  ID:  Jon Hodges, DOB 12-01-1965, MRN [id number] PCP: Jon Opal, DO  [facility] Providers Cardiologist:  Jon JINNY Lawrence, MD       Patient Profile:  Coronary artery calcification CAC score 11/22/2023: 28.6 (61st percentile) PVCs TTE 11/29/2023: EF 60-65, no RWMA, normal RVSF, RAP 3 Monitor 11/2023: 31 episodes SVT/atrial tachycardia (longest 14.5 seconds); PACs/PVCs <1%; no AF/high-grade heart block/bradycardia arrhythmias Hypertension  Hyperlipidemia Aortic atherosclerosis Tobacco use       Discussed the use of AI scribe software for clinical note transcription with the patient, who gave verbal consent to proceed. History of Present Illness Jon Hodges is a 58 y.o. male returns for follow-up of palpitations, coronary artery calcification.  He was evaluated by Dr. Lawrence 10/21/2023 for irregular heartbeat discovered during routine visit prior to colonoscopy.  He was noted to have frequent PVCs on electrocardiogram.  He has a history of significant caffeine intake.  Metoprolol  succinate 25 mg daily was started.  Caffeine reduction was recommended.  Event monitor demonstrated <1% PVCs.  There were 2 episodes of SVT/atrial tachycardia with the longest being 14.5 seconds.  Echocardiogram demonstrated EF 60-65.  CAC score was mildly elevated at 28.6 placing him in the 61st percentile.  He is here with his girlfriend. He has reduced caffeine intake. He has not had palpitations. No chest pain, shortness of breath, or syncope. He is still smoking.    ROS-See HPI    Studies Reviewed:      Risk Assessment/Calculations:       STOP-Bang Score:  5     Physical Exam:  VS:  BP 138/85   Pulse (!) 52   Ht 5' 10 (1.778 m)   Wt 194 lb 3.2 oz (88.1 kg)   SpO2 95%   BMI 27.86 kg/m        Wt Readings from Last 3 Encounters:  12/06/23 194 lb 3.2 oz (88.1 kg)  10/21/23 198 lb 3.2 oz (89.9 kg)  10/09/23 190 lb (86.2 kg)     Constitutional:      Appearance: Healthy appearance. Not in distress.  Pulmonary:     Breath sounds: Normal breath sounds. No wheezing. No rales.  Cardiovascular:     Normal rate. Regular rhythm.     Murmurs: There is no murmur.  Edema:    Peripheral edema absent.  Skin:    General: Skin is warm and dry.        Assessment and Plan:    Assessment & Plan Coronary artery calcification Coronary artery calcification with a CAC score of 28.6, placing him in the 61st percentile.  No symptoms suggestive of angina are present.   - Continue Lipitor 40 mg daily - Follow up 1 year. PVC's (premature ventricular contractions) Recent monitoring showed 31 episodes of SVT, all of which were asymptomatic and brief. Less than 1% of PVCs were noted. He is asymptomatic on current Rx. - Continue metoprolol  succinate 25 mg daily. Primary hypertension Blood pressure is currently controlled with metoprolol  succinate.   - Continue metoprolol  succinate 25 mg daily. Mixed hyperlipidemia Hyperlipidemia is being managed with atorvastatin  (Lipitor). The goal is to maintain LDL levels below 70.   - Continue Lipitor 40 mg daily. - Arrange fasting lipids and ALT test in the next 2-4 weeks. Tobacco use Encourage continued efforts to quit smoking. Preoperative cardiovascular examination Mr. Jon Hodges perioperative risk of a major cardiac event is 0.4% according to the Revised Cardiac  Risk Index (RCRI).  Therefore, he is at low risk for perioperative complications.    Recommendations: According to ACC/AHA guidelines, no further cardiovascular testing needed.  The patient may proceed to surgery at acceptable risk.        Dispo:  Return in about 1 year (around 12/05/2024) for Routine Follow Up, w/ Dr. Elmira.  Signed, Jon Ferrier, PA-C

## 2023-12-06 ENCOUNTER — Telehealth: Payer: Self-pay

## 2023-12-06 ENCOUNTER — Ambulatory Visit: Payer: MEDICAID | Attending: Physician Assistant | Admitting: Physician Assistant

## 2023-12-06 ENCOUNTER — Encounter: Payer: Self-pay | Admitting: Physician Assistant

## 2023-12-06 VITALS — BP 138/85 | HR 52 | Ht 70.0 in | Wt 194.2 lb

## 2023-12-06 DIAGNOSIS — Z720 Tobacco use: Secondary | ICD-10-CM | POA: Diagnosis present

## 2023-12-06 DIAGNOSIS — Z01810 Encounter for preprocedural cardiovascular examination: Secondary | ICD-10-CM | POA: Diagnosis present

## 2023-12-06 DIAGNOSIS — E782 Mixed hyperlipidemia: Secondary | ICD-10-CM | POA: Diagnosis present

## 2023-12-06 DIAGNOSIS — I10 Essential (primary) hypertension: Secondary | ICD-10-CM | POA: Diagnosis present

## 2023-12-06 DIAGNOSIS — I493 Ventricular premature depolarization: Secondary | ICD-10-CM | POA: Diagnosis present

## 2023-12-06 DIAGNOSIS — I2510 Atherosclerotic heart disease of native coronary artery without angina pectoris: Secondary | ICD-10-CM | POA: Insufficient documentation

## 2023-12-06 NOTE — Patient Instructions (Signed)
 Medication Instructions:  Your physician recommends that you continue on your current medications as directed. Please refer to the Current Medication list given to you today.  *If you need a refill on your cardiac medications before your next appointment, please call your pharmacy*  Lab Work: GO TO A [facility] NEAR YOU IN THE NEXT 1-2 WEEKS, FASTING, FOR:  LIPID & ALT  If you have labs (blood work) drawn today and your tests are completely normal, you will receive your results only by: MyChart Message (if you have MyChart) OR A paper copy in the mail If you have any lab test that is abnormal or we need to change your treatment, we will call you to review the results.  Testing/Procedures: None ordered  Follow-Up: At [facility], you and your health needs are our priority.  As part of our continuing mission to provide you with exceptional heart care, our providers are all part of one team.  This team includes your primary Cardiologist (physician) and Advanced Practice Providers or APPs (Physician Assistants and Nurse Practitioners) who all work together to provide you with the care you need, when you need it.  Your next appointment:   1 year(s)  Provider:   Newman JINNY Lawrence, MD    We recommend signing up for the patient portal called MyChart.  Sign up information is provided on this After Visit Summary.  MyChart is used to connect with patients for Virtual Visits (Telemedicine).  Patients are able to view lab/test results, encounter notes, upcoming appointments, etc.  Non-urgent messages can be sent to your provider as well.   To learn more about what you can do with MyChart, go to [email or web address].   Other Instructions Your PIN# for the Itamar sleep device is [id number]

## 2023-12-06 NOTE — Assessment & Plan Note (Signed)
 Hyperlipidemia is being managed with atorvastatin  (Lipitor). The goal is to maintain LDL levels below 70.   - Continue Lipitor 40 mg daily. - Arrange fasting lipids and ALT test in the next 2-4 weeks.

## 2023-12-06 NOTE — Telephone Encounter (Signed)
**Note De-Identified Jon Hodges Obfuscation** Ordering provider: Dr Elmira Associated diagnoses: Snoring-R06.83 and Apneic episode-R06.81  WatchPAT PA obtained on 12/06/2023 by Keeghan Mcintire, Avelina HERO, LPN. Authorization: Per the [place] [employer]ICA[employer] [place] [employer] website: [facility] does not require a paper referral from them to see a doctor, but your doctor may need to request a prior authorization for a specific service, such as CPT Code 04199 (Itamar-HST). [facility] does not require a PA for a Itamar-HST (CPT Code: 04199).  Patient notified of PIN ([id number]) on 12/06/2023 by Delon Medley, CMA while he was at his f/u with Glendia Ferrier, PA-c today.  Phone note routed to covering staff for follow-up.

## 2023-12-06 NOTE — Assessment & Plan Note (Signed)
 Blood pressure is currently controlled with metoprolol  succinate.   - Continue metoprolol  succinate 25 mg daily.

## 2023-12-06 NOTE — Assessment & Plan Note (Signed)
 Coronary artery calcification with a CAC score of 28.6, placing him in the 61st percentile.  No symptoms suggestive of angina are present.   - Continue Lipitor 40 mg daily - Follow up 1 year.

## 2023-12-18 ENCOUNTER — Telehealth: Payer: Self-pay

## 2023-12-18 NOTE — Telephone Encounter (Signed)
-----   Message from Wilbert Bihari sent at 12/17/2023 10:48 AM EDT ----- Genna to put in next available slot ----- Message ----- From: Bevely Connell BROCKS, LPN Sent: 07/12/7972  10:10 AM EDT To: Wilbert JONELLE Bihari, MD; Rea Spikes  Patient is requesting sooner than later appointment with Dr Turn to discuss CPAP Treatment. He has severe OSA and is refusing to believe results and wants a more thorough explanation. He is upset and feels like we are passing him around.

## 2023-12-18 NOTE — Telephone Encounter (Signed)
 Jon Hodges will get patient scheduled for OV

## 2023-12-20 ENCOUNTER — Ambulatory Visit: Admitting: Nurse Practitioner

## 2023-12-23 NOTE — Telephone Encounter (Signed)
 Left message to call back

## 2023-12-31 NOTE — Telephone Encounter (Signed)
 Spoke with patient and he will do his sleep study tonight.

## 2024-01-12 ENCOUNTER — Encounter (INDEPENDENT_AMBULATORY_CARE_PROVIDER_SITE_OTHER): Payer: Self-pay | Admitting: Cardiology

## 2024-01-12 DIAGNOSIS — G4733 Obstructive sleep apnea (adult) (pediatric): Secondary | ICD-10-CM

## 2024-01-14 ENCOUNTER — Ambulatory Visit: Attending: Cardiology

## 2024-01-14 DIAGNOSIS — R0683 Snoring: Secondary | ICD-10-CM

## 2024-01-14 DIAGNOSIS — R0681 Apnea, not elsewhere classified: Secondary | ICD-10-CM

## 2024-01-14 NOTE — Procedures (Signed)
   SLEEP STUDY REPORT Patient Information Study Date: 01/12/2024 Patient Name: Jon Hodges Patient ID: [id number] Birth Date: 1966/03/16 Age: 58 Gender:  BMI: 28.4 (W=198 lb, H=5' 10'') Stopbang: 5 Referring Physician: Elmira, MD  TEST DESCRIPTION: Home sleep apnea testing was completed using the WatchPat, a Type 1 device, utilizing  peripheral arterial tonometry (PAT), chest movement, actigraphy, pulse oximetry, pulse rate, body position and snore.  AHI was calculated with apnea and hypopnea using valid sleep time as the denominator. RDI includes apneas,  hypopneas, and RERAs. The data acquired and the scoring of sleep and all associated events were performed in  accordance with the recommended standards and specifications as outlined in the AASM Manual for the Scoring of  Sleep and Associated Events 2.2.0 (2015).  FINDINGS:  1. Moderate Obstructive Sleep Apnea with AHI 19.9/hr.   2. No Central Sleep Apnea with pAHIc 4.2/hr.  3. Oxygen desaturations as low as 90%.  4. Mild snoring was present. O2 sats were < 88% for 0 min.  5. Total sleep time was 6 hrs and 26 min.  6. 16.6% of total sleep time was spent in REM sleep.   7. Shortened sleep onset latency at 5 min  8. Shortened REM sleep onset latency at 76 min.   9. Total awakenings were 6.  10. Arrhythmia detection: None  DIAGNOSIS:  Moderate Obstructive Sleep Apnea (G47.33)  RECOMMENDATIONS: 1. Clinical correlation of these findings is necessary. The decision to treat obstructive sleep apnea (OSA) is usually  based on the presence of apnea symptoms or the presence of associated medical conditions such as Hypertension,  Congestive Heart Failure, Atrial Fibrillation or Obesity. The most common symptoms of OSA are snoring, gasping for  breath while sleeping, daytime sleepiness and fatigue.  2. Initiating apnea therapy is recommended given the presence of symptoms and/or associated conditions.  Recommend proceeding with  one of the following:  a. Auto-CPAP therapy with a pressure range of 5-20cm H2O.  b. An oral appliance (OA) that can be obtained from certain dentists with expertise in sleep medicine. These are  primarily of use in non-obese patients with mild and moderate disease.  c. An ENT consultation which may be useful to look for specific causes of obstruction and possible treatment  options.  d. If patient is intolerant to PAP therapy, consider referral to ENT for evaluation for hypoglossal nerve stimulator.  3. Close follow-up is necessary to ensure success with CPAP or oral appliance therapy for maximum benefit . 4. A follow-up oximetry study on CPAP is recommended to assess the adequacy of therapy and determine the need  for supplemental oxygen or the potential need for Bi-level therapy. An arterial blood gas to determine the adequacy of  baseline ventilation and oxygenation should also be considered. 5. Healthy sleep recommendations include: adequate nightly sleep (normal 7-9 hrs/night), avoidance of caffeine after  noon and alcohol near bedtime, and maintaining a sleep environment that is cool, dark and quiet. 6. Weight loss for overweight patients is recommended. Even modest amounts of weight loss can significantly  improve the severity of sleep apnea. 7. Snoring recommendations include: weight loss where appropriate, side sleeping, and avoidance of alcohol before  bed. 8. Operation of motor vehicle should not be performed when sleepy.  Signature: Wilbert Bihari, MD; [facility]; Diplomat, American Board of Sleep  Medicine Electronically Signed: 01/14/2024 11:23:35 AM

## 2024-01-16 ENCOUNTER — Other Ambulatory Visit: Payer: Self-pay | Admitting: Orthopaedic Surgery

## 2024-01-17 NOTE — Telephone Encounter (Signed)
Resulted

## 2024-01-17 NOTE — Progress Notes (Signed)
 Sent message, via epic in basket, requesting orders in epic from Careers adviser.

## 2024-01-22 NOTE — Progress Notes (Addendum)
  Date of COVID positive in last 90 days:  No  PCP - Milo Costa, DO (office note requested) Cardiologist - Newman Lawrence, MD  Cardiac clearance in Epic dated 12-06-23   Chest x-ray - N/A EKG - 10-21-23 Epic Stress Test - Yes ECHO - 11-29-23 Epic Cardiac Cath - N/A Pacemaker/ICD device last checked: N/A Spinal Cord Stimulator: N/A Cardiac CT - 11-22-23 Epic Long Term Monitor - 11-18-23 Epic  Bowel Prep - N/A  Sleep Study -  Yes, has not received results.  Stop Bang 6 CPAP -   Fasting Blood Sugar -  Checks Blood Sugar _____ times a day  Last dose of GLP1 agonist-  N/A GLP1 instructions:  Do not take after     Last dose of SGLT-2 inhibitors-  N/A SGLT-2 instructions:  Do not take after    Blood Thinner Instructions:  N/A Aspirin Instructions: Last Dose:  Activity level:  Can go up a flight of stairs and perform activities of daily living without stopping and without symptoms of chest pain or shortness of breath.  Anesthesia review: Coronary artery calcifications, PVCs, HTN, bradycardia.  Patient states that recently on table for colonoscopy, procedure stopped heart rate dropped to the 20s.  Methadone - managed by [facility], he has advised them of surgery and they advised him to continue.  Patient denies shortness of breath, fever, cough and chest pain at PAT appointment  Patient verbalized understanding of instructions that were given to them at the PAT appointment. Patient was also instructed that they will need to review over the PAT instructions again at home before surgery.

## 2024-01-22 NOTE — Patient Instructions (Addendum)
 SURGICAL WAITING ROOM VISITATION Patients having surgery or a procedure may have no more than 2 support people in the waiting area - these visitors may rotate.    Children under the age of 53 must have an adult with them who is not the patient.  If the patient needs to stay at the hospital during part of their recovery, the visitor guidelines for inpatient rooms apply. Pre-op nurse will coordinate an appropriate time for 1 support person to accompany patient in pre-op.  This support person may not rotate.    Please refer to the [facility] website for the visitor guidelines for Inpatients (after your surgery is over and you are in a regular room).       Your procedure is scheduled on: 01-28-24   Report to  [facility] Main Entrance    Report to admitting at 12:15 PM   Call this number if you have problems the morning of surgery [phone]   Do not eat food :After Midnight.   After Midnight you may have the following liquids until 11:45 AM DAY OF SURGERY  Water Non-Citrus Juices (without pulp, NO RED-Apple, White grape, White cranberry) Black Coffee (NO MILK/CREAM OR CREAMERS, sugar ok)  Clear Tea (NO MILK/CREAM OR CREAMERS, sugar ok) regular and decaf                             Plain Jell-O (NO RED)                                           Fruit ices (not with fruit pulp, NO RED)                                     Popsicles (NO RED)                                                               Sports drinks like Gatorade (NO RED)                   The day of surgery:  Drink ONE (1) Pre-Surgery Clear Ensure by 11:45 AM the morning of surgery. Drink in one sitting. Do not sip.  This drink was given to you during your hospital  pre-op appointment visit. Nothing else to drink after completing the Pre-Surgery Clear Ensure.          If you have questions, please contact your surgeon's office.   FOLLOW ANY ADDITIONAL PRE OP INSTRUCTIONS YOU RECEIVED FROM YOUR  SURGEON'S OFFICE!!!     Oral Hygiene is also important to reduce your risk of infection.                                    Remember - BRUSH YOUR TEETH THE MORNING OF SURGERY WITH YOUR REGULAR TOOTHPASTE   Do NOT smoke after Midnight   Take these medicines the morning of surgery with A SIP OF WATER:    Atorvastatin    Metoprolol   Stop all vitamins and herbal supplements 7 days before surgery                              You may not have any metal on your body including jewelry, and body piercing             Do not wear lotions, powders, cologne, or deodorant              Men may shave face and neck.   Do not bring valuables to the hospital. [facility] IS NOT RESPONSIBLE   FOR VALUABLES.   Contacts, dentures or bridgework may not be worn into surgery.  DO NOT BRING YOUR HOME MEDICATIONS TO THE HOSPITAL. PHARMACY WILL DISPENSE MEDICATIONS LISTED ON YOUR MEDICATION LIST TO YOU DURING YOUR ADMISSION IN THE HOSPITAL!    Patients discharged on the day of surgery will not be allowed to drive home.  Someone NEEDS to stay with you for the first 24 hours after anesthesia.                Please read over the following fact sheets you were given: IF YOU HAVE QUESTIONS ABOUT YOUR PRE-OP INSTRUCTIONS PLEASE CALL [phone] [facility]  If you received a COVID test during your pre-op visit  it is requested that you wear a mask when out in public, stay away from anyone that may not be feeling well and notify your surgeon if you develop symptoms. If you test positive for Covid or have been in contact with anyone that has tested positive in the last 10 days please notify you surgeon.   Pre-operative 5 CHG Bath Instructions   You can play a key role in reducing the risk of infection after surgery. Your skin needs to be as free of germs as possible. You can reduce the number of germs on your skin by washing with CHG (chlorhexidine  gluconate) soap before surgery. CHG is an antiseptic soap that kills germs  and continues to kill germs even after washing.   DO NOT use if you have an allergy to chlorhexidine /CHG or antibacterial soaps. If your skin becomes reddened or irritated, stop using the CHG and notify one of our RNs at [phone].   Please shower with the CHG soap starting 4 days before surgery using the following schedule:     Please keep in mind the following:  DO NOT shave, including legs and underarms, starting the day of your first shower.   You may shave your face at any point before/day of surgery.  Place clean sheets on your bed the day you start using CHG soap. Use a clean washcloth (not used since being washed) for each shower. DO NOT sleep with pets once you start using the CHG.   CHG Shower Instructions:  If you choose to wash your hair and private area, wash first with your normal shampoo/soap.  After you use shampoo/soap, rinse your hair and body thoroughly to remove shampoo/soap residue.  Turn the water OFF and apply about 3 tablespoons (45 ml) of CHG soap to a CLEAN washcloth.  Apply CHG soap ONLY FROM YOUR NECK DOWN TO YOUR TOES (washing for 3-5 minutes)  DO NOT use CHG soap on face, private areas, open wounds, or sores.  Pay special attention to the area where your surgery is being performed.  If you are having back surgery, having someone wash your back for you may be helpful. Wait 2 minutes  after CHG soap is applied, then you may rinse off the CHG soap.  Pat dry with a clean towel  Put on clean clothes/pajamas   If you choose to wear lotion, please use ONLY the CHG-compatible lotions on the back of this paper.     Additional instructions for the day of surgery: DO NOT APPLY any lotions, deodorants, cologne, or perfumes.   Put on clean/comfortable clothes.  Brush your teeth.  Ask your nurse before applying any prescription medications to the skin.      CHG Compatible Lotions   Aveeno Moisturizing lotion  Cetaphil Moisturizing Cream  Cetaphil  Moisturizing Lotion  Clairol Herbal Essence Moisturizing Lotion, Dry Skin  Clairol Herbal Essence Moisturizing Lotion, Extra Dry Skin  Clairol Herbal Essence Moisturizing Lotion, Normal Skin  Curel Age Defying Therapeutic Moisturizing Lotion with Alpha Hydroxy  Curel Extreme Care Body Lotion  Curel Soothing Hands Moisturizing Hand Lotion  Curel Therapeutic Moisturizing Cream, Fragrance-Free  Curel Therapeutic Moisturizing Lotion, Fragrance-Free  Curel Therapeutic Moisturizing Lotion, Original Formula  Eucerin Daily Replenishing Lotion  Eucerin Dry Skin Therapy Plus Alpha Hydroxy Crme  Eucerin Dry Skin Therapy Plus Alpha Hydroxy Lotion  Eucerin Original Crme  Eucerin Original Lotion  Eucerin Plus Crme Eucerin Plus Lotion  Eucerin TriLipid Replenishing Lotion  Keri Anti-Bacterial Hand Lotion  Keri Deep Conditioning Original Lotion Dry Skin Formula Softly Scented  Keri Deep Conditioning Original Lotion, Fragrance Free Sensitive Skin Formula  Keri Lotion Fast Absorbing Fragrance Free Sensitive Skin Formula  Keri Lotion Fast Absorbing Softly Scented Dry Skin Formula  Keri Original Lotion  Keri Skin Renewal Lotion Keri Silky Smooth Lotion  Keri Silky Smooth Sensitive Skin Lotion  Nivea Body Creamy Conditioning Oil  Nivea Body Extra Enriched Lotion  Nivea Body Original Lotion  Nivea Body Sheer Moisturizing Lotion Nivea Crme  Nivea Skin Firming Lotion  NutraDerm 30 Skin Lotion  NutraDerm Skin Lotion  NutraDerm Therapeutic Skin Cream  NutraDerm Therapeutic Skin Lotion  ProShield Protective Hand Cream  Provon moisturizing lotion   PATIENT SIGNATURE_________________________________  NURSE SIGNATURE__________________________________  ________________________________________________________________________    Nasario Exon  An incentive spirometer is a tool that can help keep your lungs clear and active. This tool measures how well you are filling your lungs with each  breath. Taking long deep breaths may help reverse or decrease the chance of developing breathing (pulmonary) problems (especially infection) following: A long period of time when you are unable to move or be active. BEFORE THE PROCEDURE  If the spirometer includes an indicator to show your best effort, your nurse or respiratory therapist will set it to a desired goal. If possible, sit up straight or lean slightly forward. Try not to slouch. Hold the incentive spirometer in an upright position. INSTRUCTIONS FOR USE  Sit on the edge of your bed if possible, or sit up as far as you can in bed or on a chair. Hold the incentive spirometer in an upright position. Breathe out normally. Place the mouthpiece in your mouth and seal your lips tightly around it. Breathe in slowly and as deeply as possible, raising the piston or the ball toward the top of the column. Hold your breath for 3-5 seconds or for as long as possible. Allow the piston or ball to fall to the bottom of the column. Remove the mouthpiece from your mouth and breathe out normally. Rest for a few seconds and repeat Steps 1 through 7 at least 10 times every 1-2 hours when you are awake. Take your time  and take a few normal breaths between deep breaths. The spirometer may include an indicator to show your best effort. Use the indicator as a goal to work toward during each repetition. After each set of 10 deep breaths, practice coughing to be sure your lungs are clear. If you have an incision (the cut made at the time of surgery), support your incision when coughing by placing a pillow or rolled up towels firmly against it. Once you are able to get out of bed, walk around indoors and cough well. You may stop using the incentive spirometer when instructed by your caregiver.  RISKS AND COMPLICATIONS Take your time so you do not get dizzy or light-headed. If you are in pain, you may need to take or ask for pain medication before doing incentive  spirometry. It is harder to take a deep breath if you are having pain. AFTER USE Rest and breathe slowly and easily. It can be helpful to keep track of a log of your progress. Your caregiver can provide you with a simple table to help with this. If you are using the spirometer at home, follow these instructions: SEEK MEDICAL CARE IF:  You are having difficultly using the spirometer. You have trouble using the spirometer as often as instructed. Your pain medication is not giving enough relief while using the spirometer. You develop fever of 100.5 F (38.1 C) or higher. SEEK IMMEDIATE MEDICAL CARE IF:  You cough up bloody sputum that had not been present before. You develop fever of 102 F (38.9 C) or greater. You develop worsening pain at or near the incision site. MAKE SURE YOU:  Understand these instructions. Will watch your condition. Will get help right away if you are not doing well or get worse. Document Released: 10/08/2006 Document Revised: 08/20/2011 Document Reviewed: 12/09/2006 [facility] Patient Information 2014 [place], [place].

## 2024-01-23 NOTE — H&P (Signed)
 Jon Hodges is an 58 y.o. male.   Chief Complaint:  left thumb pain HPI: Jon Hodges is here for follow-up of his left hand.  He says he really does not have much numbness, but the greater problem is pain at the base of both of his left thumb.  We injected both back in February and he says that helped tremendously for a week or 2.  He wears braces on his thumbs. 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  He is left-hand-dominant   Imaging/Tests: We took 3 views of both hands.  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  Past Medical History:  Diagnosis Date   Anxiety    Arthritis    Back pain    Carpal tunnel syndrome    Coronary artery calcification 12/05/2023   CAC score 11/22/2023: 28.6 (61st percentile)     ED (erectile dysfunction)    Elbow pain     Past Surgical History:  Procedure Laterality Date   FOOT SURGERY     KNEE ARTHROSCOPY     KNEE SURGERY Bilateral    WRIST SURGERY Bilateral     Family History  Problem Relation Age of Onset   Cancer Brother    CVA Maternal Uncle    CAD Maternal Uncle    Cancer Maternal Aunt    Social History:  reports that he has been smoking cigarettes. He has never used smokeless tobacco. He reports that he does not currently use alcohol. He reports that he does not use drugs.  Allergies: No Known Allergies  No medications prior to admission.    No results found for this or any previous visit (from the past 48 hours). No results found.  Review of Systems  Musculoskeletal:  Positive for arthralgias.       Left thumb  All other systems reviewed and are negative.   There were no vitals taken for this visit. Physical Exam Constitutional:      Appearance: Normal appearance.  HENT:     Head: Normocephalic and atraumatic.     Nose: Nose normal.     Mouth/Throat:     Pharynx: Oropharynx is clear.  Eyes:     Extraocular Movements: Extraocular movements intact.  Pulmonary:     Effort:  Pulmonary effort is normal.  Abdominal:     Palpations: Abdomen is soft.  Musculoskeletal:     Cervical back: Normal range of motion.     Comments: Exam is the same with terrible pain at both basal joints.  He also has some MCP pain at the right index finger.  I really do not appreciate much thenar atrophy today on either side.  Sensation is intact to the tips of the fingers.   Skin:    General: Skin is warm and dry.  Neurological:     General: No focal deficit present.     Mental Status: He is alert.  Psychiatric:        Mood and Affect: Mood normal.        Behavior: Behavior normal.        Thought Content: Thought content normal.        Judgment: Judgment normal.      Assessment/Plan Assessment:  Left basal joint degeneration   Plan: Jon Hodges would just like his hands not to hurt anymore.  He has had injections in his basal joints and used braces for years.  He has advanced degenerative change.  He does have some moderate recurrent  carpal tunnel on both sides but insists he really does not have any numbness.  I think the indicated procedure would be a left thumb basal joint suspension plasty.  I reviewed risk of anesthesia, infection, neurovascular injury.  I told him he would not be using his hand for any forceful gripping for at least a month.    Prentice Mt Rahaf Carbonell, PA-C 01/23/2024, 8:24 AM

## 2024-01-24 ENCOUNTER — Encounter (HOSPITAL_COMMUNITY)
Admission: RE | Admit: 2024-01-24 | Discharge: 2024-01-24 | Disposition: A | Discharge status: Home / Self Care | Payer: MEDICAID | Source: Ambulatory Visit | Attending: Orthopaedic Surgery | Admitting: Orthopaedic Surgery

## 2024-01-24 ENCOUNTER — Other Ambulatory Visit: Payer: Self-pay

## 2024-01-24 ENCOUNTER — Encounter (HOSPITAL_COMMUNITY): Payer: Self-pay

## 2024-01-24 VITALS — BP 149/88 | HR 52 | Temp 98.9°F | Resp 12 | Ht 70.0 in | Wt 197.0 lb

## 2024-01-24 DIAGNOSIS — Z720 Tobacco use: Secondary | ICD-10-CM | POA: Insufficient documentation

## 2024-01-24 DIAGNOSIS — I2510 Atherosclerotic heart disease of native coronary artery without angina pectoris: Secondary | ICD-10-CM | POA: Diagnosis not present

## 2024-01-24 DIAGNOSIS — G4733 Obstructive sleep apnea (adult) (pediatric): Secondary | ICD-10-CM | POA: Insufficient documentation

## 2024-01-24 DIAGNOSIS — Z01818 Encounter for other preprocedural examination: Secondary | ICD-10-CM

## 2024-01-24 DIAGNOSIS — Z01812 Encounter for preprocedural laboratory examination: Secondary | ICD-10-CM | POA: Diagnosis present

## 2024-01-24 DIAGNOSIS — M1812 Unilateral primary osteoarthritis of first carpometacarpal joint, left hand: Secondary | ICD-10-CM | POA: Insufficient documentation

## 2024-01-24 DIAGNOSIS — Z8719 Personal history of other diseases of the digestive system: Secondary | ICD-10-CM | POA: Diagnosis not present

## 2024-01-24 DIAGNOSIS — I493 Ventricular premature depolarization: Secondary | ICD-10-CM | POA: Diagnosis not present

## 2024-01-24 DIAGNOSIS — I10 Essential (primary) hypertension: Secondary | ICD-10-CM | POA: Diagnosis not present

## 2024-01-24 HISTORY — DX: Gastro-esophageal reflux disease without esophagitis: K21.9

## 2024-01-24 HISTORY — DX: Bradycardia, unspecified: R00.1

## 2024-01-24 HISTORY — DX: Essential (primary) hypertension: I10

## 2024-01-24 LAB — BASIC METABOLIC PANEL WITH GFR
Anion gap: 7 (ref 5–15)
BUN: 20 mg/dL (ref 6–20)
CO2: 25 mmol/L (ref 22–32)
Calcium: 8.9 mg/dL (ref 8.9–10.3)
Chloride: 108 mmol/L (ref 98–111)
Creatinine, Ser: 0.88 mg/dL (ref 0.61–1.24)
GFR, Estimated: >60 mL/min (ref >60)
Glucose, Bld: 93 mg/dL (ref 70–99)
Potassium: 4.5 mmol/L (ref 3.5–5.1)
Sodium: 140 mmol/L (ref 135–145)

## 2024-01-24 LAB — CBC
HCT: 39 % (ref 39.0–52.0)
Hemoglobin: 13.2 g/dL (ref 13.0–17.0)
MCH: 31.1 pg (ref 26.0–34.0)
MCHC: 33.8 g/dL (ref 30.0–36.0)
MCV: 92 fL (ref 80.0–100.0)
Platelets: 221 K/uL (ref 150–400)
RBC: 4.24 MIL/uL (ref 4.22–5.81)
RDW: 13.2 % (ref 11.5–15.5)
WBC: 8.3 K/uL (ref 4.0–10.5)
nRBC: 0 % (ref 0.0–0.2)

## 2024-01-24 LAB — SURGICAL PCR SCREEN
MRSA, PCR: NEGATIVE
Staphylococcus aureus: NEGATIVE

## 2024-01-27 NOTE — Progress Notes (Addendum)
 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 smoker with h/o GERD, OSA (recently diagnosed, moderate OSA, has not started CPAP yet), HTN, CAD, PVCs, left thumb basal joint djd scheduled for above procedure 01/28/2024 with Dr. Coy Sheril.   Pt last seen by cardiology 12/06/2023. Coronary artery calcification with a CAC score of 28.6, placing him in the 61st percentile. No symptoms suggestive of angina are present. Per notes, Mr. Vegh perioperative risk of a major cardiac event is 0.4% according to the Revised Cardiac Risk Index (RCRI).  Therefore, he is at low risk for perioperative complications.    Recommendations: According to ACC/AHA guidelines, no further cardiovascular testing needed.  The patient may proceed to surgery at acceptable risk  VS: BP (!) 149/88   Pulse (!) 52   Temp 37.2 C (Oral)   Resp 12   Ht 5' 10 (1.778 m)   Wt 89.4 kg   SpO2 95%   BMI 28.27 kg/m   PROVIDERS: Auston Opal, DO is PCP   Shlomo Wilbert SAUNDERS, MD is Cardiologist  LABS: Labs reviewed: Acceptable for surgery. (all labs ordered are listed, but only abnormal results are displayed)  Labs Reviewed  SURGICAL PCR SCREEN  BASIC METABOLIC PANEL WITH GFR  CBC     IMAGES:   EKG:   CV: Echo 11/29/2023 1. Left ventricular ejection fraction, by estimation, is 60 to 65%. Left  ventricular ejection fraction by 3D volume is 64 %. The left ventricle has  normal function. The left ventricle has no regional wall motion  abnormalities. Left ventricular diastolic   parameters were normal. The average left  ventricular global longitudinal  strain is -22.6 %. The global longitudinal strain is normal.   2. Right ventricular systolic function is normal. The right ventricular  size is normal. Tricuspid regurgitation signal is inadequate for assessing  PA pressure.   3. The mitral valve is grossly normal. No evidence of mitral valve  regurgitation.   4. The aortic valve is tricuspid. Aortic valve regurgitation is not  visualized.   5. The inferior vena cava is normal in size with greater than 50%  respiratory variability, suggesting right atrial pressure of 3 mmHg.   Comparison(s): No prior Echocardiogram.  Past Medical History:  Diagnosis Date   Anxiety    Arthritis    Back pain    Bradycardia    Carpal tunnel syndrome    Coronary artery calcification 12/05/2023   CAC score 11/22/2023: 28.6 (61st percentile)     ED (erectile dysfunction)    Elbow pain    GERD (gastroesophageal reflux disease)    Hypertension     Past Surgical History:  Procedure Laterality Date   FOOT SURGERY     KNEE ARTHROSCOPY     KNEE SURGERY Bilateral    WRIST SURGERY Bilateral     MEDICATIONS:  atorvastatin  (LIPITOR) 40 MG tablet   ibuprofen  (ADVIL ,MOTRIN ) 800 MG tablet   methadone (DOLOPHINE) 10 MG/ML solution   metoprolol  succinate (TOPROL  XL) 25 MG 24 hr tablet   nicotine  (NICODERM CQ  - DOSED IN MG/24 HOURS) 14 mg/24hr patch   No current  facility-administered medications for this encounter.     Harlene Hoots Ward, PA-C [facility] [phone]

## 2024-01-28 ENCOUNTER — Encounter (HOSPITAL_COMMUNITY)
Admission: RE | Disposition: A | Discharge status: Home / Self Care | Payer: Self-pay | Source: Home / Self Care | Attending: Orthopaedic Surgery

## 2024-01-28 ENCOUNTER — Encounter (HOSPITAL_COMMUNITY): Payer: MEDICAID | Admitting: Medical

## 2024-01-28 ENCOUNTER — Ambulatory Visit (HOSPITAL_COMMUNITY)
Admission: RE | Admit: 2024-01-28 | Discharge: 2024-01-28 | Disposition: A | Discharge status: Home / Self Care | Payer: MEDICAID | Attending: Orthopaedic Surgery | Admitting: Orthopaedic Surgery

## 2024-01-28 ENCOUNTER — Ambulatory Visit (HOSPITAL_BASED_OUTPATIENT_CLINIC_OR_DEPARTMENT_OTHER): Payer: MEDICAID | Admitting: Anesthesiology

## 2024-01-28 ENCOUNTER — Encounter (HOSPITAL_COMMUNITY): Payer: Self-pay | Admitting: Orthopaedic Surgery

## 2024-01-28 DIAGNOSIS — I2510 Atherosclerotic heart disease of native coronary artery without angina pectoris: Secondary | ICD-10-CM | POA: Diagnosis not present

## 2024-01-28 DIAGNOSIS — M180 Bilateral primary osteoarthritis of first carpometacarpal joints: Secondary | ICD-10-CM | POA: Insufficient documentation

## 2024-01-28 DIAGNOSIS — I10 Essential (primary) hypertension: Secondary | ICD-10-CM | POA: Insufficient documentation

## 2024-01-28 DIAGNOSIS — M1812 Unilateral primary osteoarthritis of first carpometacarpal joint, left hand: Secondary | ICD-10-CM | POA: Diagnosis not present

## 2024-01-28 DIAGNOSIS — F17210 Nicotine dependence, cigarettes, uncomplicated: Secondary | ICD-10-CM | POA: Diagnosis not present

## 2024-01-28 SURGERY — CARPOMETACARPEL (CMC) SUSPENSION PLASTY
Anesthesia: General | Site: Thumb | Laterality: Left

## 2024-01-28 MED ORDER — CHLORHEXIDINE GLUCONATE 0.12 % MT SOLN
15.0000 mL | Freq: Once | OROMUCOSAL | Status: AC
Start: 2024-01-28 — End: 2024-01-28
  Administered 2024-01-28: 15 mL via OROMUCOSAL

## 2024-01-28 MED ORDER — EPHEDRINE SULFATE-NACL 50-0.9 MG/10ML-% IV SOSY
PREFILLED_SYRINGE | INTRAVENOUS | Status: DC | PRN
Start: 2024-01-28 — End: 2024-01-28
  Administered 2024-01-28: 10 mg via INTRAVENOUS
  Administered 2024-01-28: 5 mg via INTRAVENOUS
  Administered 2024-01-28: 10 mg via INTRAVENOUS

## 2024-01-28 MED ORDER — MIDAZOLAM HCL 5 MG/5ML IJ SOLN
INTRAMUSCULAR | Status: DC | PRN
  Administered 2024-01-28: 2 mg via INTRAVENOUS

## 2024-01-28 MED ORDER — OXYCODONE HCL 5 MG PO TABS
5.0000 mg | ORAL_TABLET | Freq: Once | ORAL | Status: AC | PRN
  Administered 2024-01-28: 5 mg via ORAL

## 2024-01-28 MED ORDER — DROPERIDOL 2.5 MG/ML IJ SOLN: 0.6250 mg | Freq: Once | INTRAMUSCULAR | Status: DC | PRN

## 2024-01-28 MED ORDER — FENTANYL CITRATE (PF) 100 MCG/2ML IJ SOLN
INTRAMUSCULAR | Status: DC | PRN
  Administered 2024-01-28: 25 ug via INTRAVENOUS

## 2024-01-28 MED ORDER — BUPIVACAINE LIPOSOME 1.3 % IJ SUSP
INTRAMUSCULAR | Status: DC | PRN
  Administered 2024-01-28: 5 mL

## 2024-01-28 MED ORDER — IBUPROFEN 800 MG PO TABS: 800.0000 mg | ORAL_TABLET | Freq: Three times a day (TID) | ORAL | 0 refills | Status: AC | PRN

## 2024-01-28 MED ORDER — PROPOFOL 10 MG/ML IV BOLUS
INTRAVENOUS | Status: AC
  Filled 2024-01-28: qty 20

## 2024-01-28 MED ORDER — HYDROMORPHONE HCL 1 MG/ML IJ SOLN
INTRAMUSCULAR | Status: AC
  Filled 2024-01-28: qty 1

## 2024-01-28 MED ORDER — BUPIVACAINE HCL (PF) 0.25 % IJ SOLN
INTRAMUSCULAR | Status: AC
  Filled 2024-01-28: qty 30

## 2024-01-28 MED ORDER — EPHEDRINE 5 MG/ML INJ
INTRAVENOUS | Status: AC
  Filled 2024-01-28: qty 5

## 2024-01-28 MED ORDER — FENTANYL CITRATE (PF) 100 MCG/2ML IJ SOLN
INTRAMUSCULAR | Status: AC
  Filled 2024-01-28: qty 2

## 2024-01-28 MED ORDER — HYDROMORPHONE HCL 1 MG/ML IJ SOLN
INTRAMUSCULAR | Status: AC
Start: 2024-01-28 — End: 2024-01-28
  Filled 2024-01-28: qty 1

## 2024-01-28 MED ORDER — MIDAZOLAM HCL 2 MG/2ML IJ SOLN: 1.0000 mg | INTRAMUSCULAR | Status: DC

## 2024-01-28 MED ORDER — ONDANSETRON HCL 4 MG/2ML IJ SOLN: 4.0000 mg | Freq: Once | INTRAMUSCULAR | Status: DC | PRN

## 2024-01-28 MED ORDER — ORAL CARE MOUTH RINSE: 15.0000 mL | Freq: Once | OROMUCOSAL | Status: AC

## 2024-01-28 MED ORDER — OXYCODONE HCL 5 MG PO TABS
ORAL_TABLET | ORAL | Status: AC
  Filled 2024-01-28: qty 1

## 2024-01-28 MED ORDER — HYDROMORPHONE HCL 1 MG/ML IJ SOLN
0.2500 mg | INTRAMUSCULAR | Status: DC | PRN
  Administered 2024-01-28 (×3): 0.5 mg via INTRAVENOUS

## 2024-01-28 MED ORDER — PROPOFOL 10 MG/ML IV BOLUS
INTRAVENOUS | Status: DC | PRN
  Administered 2024-01-28: 140 mg via INTRAVENOUS

## 2024-01-28 MED ORDER — CEFAZOLIN SODIUM-DEXTROSE 2-4 GM/100ML-% IV SOLN
2.0000 g | INTRAVENOUS | Status: AC
  Administered 2024-01-28: 2 g via INTRAVENOUS
  Filled 2024-01-28: qty 100

## 2024-01-28 MED ORDER — LIDOCAINE 2% (20 MG/ML) 5 ML SYRINGE
INTRAMUSCULAR | Status: DC | PRN
  Administered 2024-01-28: 60 mg via INTRAVENOUS

## 2024-01-28 MED ORDER — MIDAZOLAM HCL 2 MG/2ML IJ SOLN
INTRAMUSCULAR | Status: AC
Start: 2024-01-28 — End: 2024-01-28
  Filled 2024-01-28: qty 2

## 2024-01-28 MED ORDER — BUPIVACAINE LIPOSOME 1.3 % IJ SUSP
INTRAMUSCULAR | Status: AC
  Filled 2024-01-28: qty 20

## 2024-01-28 MED ORDER — BUPIVACAINE HCL (PF) 0.25 % IJ SOLN
INTRAMUSCULAR | Status: DC | PRN
  Administered 2024-01-28: 5 mL

## 2024-01-28 MED ORDER — PHENYLEPHRINE 80 MCG/ML (10ML) SYRINGE FOR IV PUSH (FOR BLOOD PRESSURE SUPPORT)
PREFILLED_SYRINGE | INTRAVENOUS | Status: DC | PRN
Start: 2024-01-28 — End: 2024-01-28
  Administered 2024-01-28: 80 ug via INTRAVENOUS

## 2024-01-28 MED ORDER — GLYCOPYRROLATE 0.2 MG/ML IJ SOLN
INTRAMUSCULAR | Status: AC
  Filled 2024-01-28: qty 1

## 2024-01-28 MED ORDER — DEXAMETHASONE SODIUM PHOSPHATE 10 MG/ML IJ SOLN
INTRAMUSCULAR | Status: DC | PRN
  Administered 2024-01-28: 10 mg via INTRAVENOUS

## 2024-01-28 MED ORDER — FENTANYL CITRATE PF 50 MCG/ML IJ SOSY: 50.0000 ug | PREFILLED_SYRINGE | INTRAMUSCULAR | Status: DC

## 2024-01-28 MED ORDER — ONDANSETRON HCL 4 MG/2ML IJ SOLN
INTRAMUSCULAR | Status: AC
  Filled 2024-01-28: qty 2

## 2024-01-28 MED ORDER — LACTATED RINGERS IV SOLN: INTRAVENOUS | Status: DC

## 2024-01-28 MED ORDER — OXYCODONE HCL 5 MG/5ML PO SOLN: 5.0000 mg | Freq: Once | ORAL | Status: AC | PRN

## 2024-01-28 MED ORDER — ONDANSETRON HCL 4 MG/2ML IJ SOLN
INTRAMUSCULAR | Status: DC | PRN
  Administered 2024-01-28: 4 mg via INTRAVENOUS

## 2024-01-28 MED ORDER — DEXAMETHASONE SODIUM PHOSPHATE 10 MG/ML IJ SOLN
INTRAMUSCULAR | Status: AC
  Filled 2024-01-28: qty 1

## 2024-01-28 SURGICAL SUPPLY — 39 items
ANCHOR FIBERLOCK SUSPENSION (Anchor) IMPLANT
BAG COUNTER SPONGE SURGICOUNT (BAG) ×1 IMPLANT
BNDG ELASTIC 3INX 5YD STR LF (GAUZE/BANDAGES/DRESSINGS) ×1 IMPLANT
BNDG ESMARK 4X9 LF (GAUZE/BANDAGES/DRESSINGS) ×1 IMPLANT
BNDG GAUZE DERMACEA FLUFF 4 (GAUZE/BANDAGES/DRESSINGS) ×3 IMPLANT
CLSR STERI-STRIP ANTIMIC 1/2X4 (GAUZE/BANDAGES/DRESSINGS) IMPLANT
CORD BIPOLAR FORCEPS 12FT (ELECTRODE) ×1 IMPLANT
COVER SURGICAL LIGHT HANDLE (MISCELLANEOUS) ×1 IMPLANT
CUFF TOURN SGL QUICK 18X4 (TOURNIQUET CUFF) ×1 IMPLANT
CUFF TRNQT CYL 24X4X16.5-23 (TOURNIQUET CUFF) IMPLANT
DRAPE OEC MINIVIEW 54X84 (DRAPES) IMPLANT
DRSG ADAPTIC 3X8 NADH LF (GAUZE/BANDAGES/DRESSINGS) IMPLANT
DRSG EMULSION OIL 3X3 NADH (GAUZE/BANDAGES/DRESSINGS) ×1 IMPLANT
GAUZE SPONGE 4X4 12PLY STRL (GAUZE/BANDAGES/DRESSINGS) ×1 IMPLANT
GLOVE BIOGEL M 8.0 STRL (GLOVE) ×1 IMPLANT
GLOVE SS BIOGEL STRL SZ 8 (GLOVE) ×2 IMPLANT
GOWN STRL REUS W/ TWL LRG LVL3 (GOWN DISPOSABLE) ×3 IMPLANT
GOWN STRL REUS W/ TWL XL LVL3 (GOWN DISPOSABLE) ×2 IMPLANT
KIT BASIN OR (CUSTOM PROCEDURE TRAY) ×1 IMPLANT
NDL HYPO 25X1 1.5 SAFETY (NEEDLE) ×1 IMPLANT
NEEDLE HYPO 25X1 1.5 SAFETY (NEEDLE) ×1 IMPLANT
NS IRRIG 1000ML POUR BTL (IV SOLUTION) ×1 IMPLANT
PACK ORTHO EXTREMITY (CUSTOM PROCEDURE TRAY) ×1 IMPLANT
PAD ARMBOARD POSITIONER FOAM (MISCELLANEOUS) ×2 IMPLANT
PAD CAST 3X4 CTTN HI CHSV (CAST SUPPLIES) ×1 IMPLANT
PADDING CAST COTTON 6X4 STRL (CAST SUPPLIES) IMPLANT
SLING ARM FOAM STRAP LRG (SOFTGOODS) IMPLANT
SOL PREP POV-IOD 4OZ 10% (MISCELLANEOUS) ×2 IMPLANT
SPLINT FIBERGLASS 4X30 (CAST SUPPLIES) IMPLANT
SPLINT PLASTER CAST XFAST 5X30 (CAST SUPPLIES) IMPLANT
SUCTION TUBE FRAZIER 10FR DISP (SUCTIONS) IMPLANT
SUT PROLENE 4 0 PS 2 18 (SUTURE) ×1 IMPLANT
SUT VIC AB 3-0 FS2 27 (SUTURE) IMPLANT
SYR CONTROL 10ML LL (SYRINGE) ×1 IMPLANT
TOWEL GREEN STERILE (TOWEL DISPOSABLE) ×1 IMPLANT
TOWEL GREEN STERILE FF (TOWEL DISPOSABLE) ×1 IMPLANT
TUBE CONNECTING 12X1/4 (SUCTIONS) IMPLANT
UNDERPAD 30X36 HEAVY ABSORB (UNDERPADS AND DIAPERS) ×1 IMPLANT
WATER STERILE IRR 1000ML POUR (IV SOLUTION) ×1 IMPLANT

## 2024-01-28 NOTE — Interval H&P Note (Signed)
 History and Physical Interval Note:  01/28/2024 2:16 PM  Jon Hodges  has presented today for surgery, with the diagnosis of LEFT THUMB BASAL JOINT DEGENERATIVE JOINT DISEASE.  The various methods of treatment have been discussed with the patient and family. After consideration of risks, benefits and other options for treatment, the patient has consented to  Procedure(s) with comments: CARPOMETACARPEL (CMC) SUSPENSION PLASTY (Left) - LEFT THUMB SUSEPNSION ARTHROPLASTY as a surgical intervention.  The patient's history has been reviewed, patient examined, no change in status, stable for surgery.  I have reviewed the patient's chart and labs.  Questions were answered to the patient's satisfaction.     [name] [name]

## 2024-01-28 NOTE — Anesthesia Preprocedure Evaluation (Addendum)
 Anesthesia Evaluation  Patient identified by MRN, date of birth, ID band Patient awake    Reviewed: Allergy & Precautions, NPO status , Patient's Chart, lab work & pertinent test results, reviewed documented beta blocker date and time   Airway Mallampati: II       Dental  (+) Caps, Missing, Dental Advisory Given   Pulmonary Current Smoker and Patient abstained from smoking.   Pulmonary exam normal breath sounds clear to auscultation       Cardiovascular hypertension, Pt. on medications and Pt. on home beta blockers + CAD  Normal cardiovascular exam Rhythm:Regular Rate:Normal     Neuro/Psych   Anxiety      Neuromuscular disease  negative psych ROS   GI/Hepatic ,GERD  Medicated,,On methadone   Endo/Other    Renal/GU negative Renal ROS  negative genitourinary   Musculoskeletal  (+) Arthritis , Osteoarthritis,  Primary arthritis CMC joint left thumb Chronic back pain   Abdominal   Peds  Hematology negative hematology ROS (+)   Anesthesia Other Findings   Reproductive/Obstetrics ED                              Anesthesia Physical Anesthesia Plan  ASA: 2  Anesthesia Plan: General   Post-op Pain Management:    Induction:   PONV Risk Score and Plan: 2 and Treatment may vary due to age or medical condition, Midazolam , Ondansetron  and Dexamethasone   Airway Management Planned: LMA  Additional Equipment: None  Intra-op Plan:   Post-operative Plan: Extubation in OR  Informed Consent: I have reviewed the patients History and Physical, chart, labs and discussed the procedure including the risks, benefits and alternatives for the proposed anesthesia with the patient or authorized representative who has indicated his/her understanding and acceptance.     Dental advisory given  Plan Discussed with: CRNA and Anesthesiologist  Anesthesia Plan Comments:          Anesthesia Quick  Evaluation

## 2024-01-28 NOTE — Anesthesia Procedure Notes (Signed)
 Procedure Name: LMA Insertion Date/Time: 01/28/2024 3:13 PM  Performed by: Therisa Doyal CROME, CRNAPatient Re-evaluated:Patient Re-evaluated prior to induction Oxygen Delivery Method: Circle system utilized Preoxygenation: Pre-oxygenation with 100% oxygen Induction Type: IV induction LMA: LMA inserted LMA Size: 4.0 Number of attempts: 1 Placement Confirmation: positive ETCO2 and breath sounds checked- equal and bilateral Tube secured with: Tape Dental Injury: Teeth and Oropharynx as per pre-operative assessment

## 2024-01-28 NOTE — Anesthesia Postprocedure Evaluation (Signed)
 Anesthesia Post Note  Patient: Jon Hodges  Procedure(s) Performed: CARPOMETACARPEL (CMC) SUSPENSION PLASTY (Left: Thumb)     Patient location during evaluation: PACU Anesthesia Type: General Level of consciousness: awake and alert and oriented Pain management: pain level controlled Vital Signs Assessment: post-procedure vital signs reviewed and stable Respiratory status: spontaneous breathing, nonlabored ventilation and respiratory function stable Cardiovascular status: blood pressure returned to baseline and stable Postop Assessment: no apparent nausea or vomiting Anesthetic complications: no   No notable events documented.  Last Vitals:  Vitals:   01/28/24 1730 01/28/24 1744  BP: (!) 146/85 (!) 142/78  Pulse: (!) 56 (!) 57  Resp: 12 20  Temp: (!) 36.4 C (!) 36.1 C  SpO2: 91% 94%    Last Pain:  Vitals:   01/28/24 1744  TempSrc: Temporal  PainSc: 5                  Eldra Word A.

## 2024-01-28 NOTE — Transfer of Care (Signed)
 Immediate Anesthesia Transfer of Care Note  Patient: Jon Hodges  Procedure(s) Performed: CARPOMETACARPEL (CMC) SUSPENSION PLASTY (Left: Thumb)  Patient Location: PACU  Anesthesia Type:General  Level of Consciousness: awake, alert , and oriented  Airway & Oxygen Therapy: Patient Spontanous Breathing and Patient connected to face mask oxygen  Post-op Assessment: Report given to RN and Post -op Vital signs reviewed and stable  Post vital signs: Reviewed and stable  Last Vitals:  Vitals Value Taken Time  BP 147/89 01/28/24 16:45  Temp    Pulse 66 01/28/24 16:49  Resp 18 01/28/24 16:49  SpO2 100 % 01/28/24 16:49  Vitals shown include unfiled device data.  Last Pain:  Vitals:   01/28/24 1225  TempSrc: Oral  PainSc: 0-No pain         Complications: No notable events documented.

## 2024-01-28 NOTE — Brief Op Note (Signed)
  BRAESON RUPE [id number] 01/28/2024   PRE-OP DIAGNOSIS: left basal joint DJD  POST-OP DIAGNOSIS: same  PROCEDURE: left thumb basal joint arthoplasty  ANESTHESIA: general  Maude KANDICE Herald   Dictation #:  [id number]

## 2024-01-29 NOTE — Op Note (Signed)
 NAME: Jon Hodges, Jon Hodges. MEDICAL RECORD NO: [id number] ACCOUNT NO: [id number] DATE OF BIRTH: [birth year] FACILITY: [facility] LOCATION: [facility] PHYSICIAN: Maude MATSU. Sheril, MD  Operative Report   DATE OF PROCEDURE: 01/28/2024  PREOPERATIVE DIAGNOSIS: Left thumb basal joint degeneration.  POSTOPERATIVE DIAGNOSIS: Left thumb basal joint degeneration.  PROCEDURE PERFORMED: Left thumb suspension arthroplasty.  ANESTHESIA:  General.  ATTENDING SURGEON: Maude MATSU. Teagan Heidrick, MD  ASSISTANT: Prentice Earl, PA.  INDICATIONS: The patient is a 58 year old man with a long history of bilateral wrist pain related to advanced basal joint degeneration. He has been treated with bracing, injections, and oral anti-inflammatories, but continues with somewhat disabling  pain. He was offered a suspension arthroplasty on the left, which is his most painful side. Informed operative consent was obtained after discussion of possible complications including reaction to anesthesia, infection, and neurovascular injury.  SUMMARY OF FINDINGS AND PROCEDURE:  Under general anesthesia through a small incision at the base of his thumb, a left basal joint suspension arthroplasty was performed.  We used the [employer] system. I used fluoroscopy throughout the case to  make appropriate intraoperative decisions and read all these views myself. Prentice Earl assisted throughout and was invaluable to the completion of the case and that he maintained retraction to make the case possible and close simultaneously to help  minimize OR time.   DESCRIPTION OF PROCEDURE:  The patient was taken to the operating suite where general anesthetic was applied without difficulty. He was positioned supine and prepped and draped in normal sterile fashion. After the administration of preoperative IV Kefzol   and appropriate timeout, the left arm was elevated, exsanguinated, and a tourniquet inflated about the upper arm. I made a small incision at the  base of the thumb from the base of the first metacarpal over the trapezium. Dissection was carried down to  the capsule here. This was incised longitudinally. I used fluoroscopy to correctly identify the trapezium and then removed this in piecemeal fashion with rongeur and osteotomes. I used fluoroscopy to confirm adequate resection of this bone. This was then  irrigated. We then used the FiberLock system by [employer]. I placed a guidewire in the base of the second metacarpal, seen to be intramedullary on two views. We then over reamed and then placed the anchor on the opposite cortex. This seemed to give us   nice fixation. We then took this FiberLock system over the base of the first metacarpal in the saddle of this bone and then made a separate drill hole on the dorsal aspect of the base of the first metacarpal and placed the second anchor there securing  the system in place. This seemed to give us  a nice suspension of the bone. I used fluoroscopy to confirm adequate placement of the system and suspension of the first metacarpal at the basal joint. The wound was irrigated followed by reapproximation of  the capsule with 2-0 undyed Vicryl. The tourniquet was deflated and the fingers became pink and warm immediately. A mild amount of bleeding was easily controlled with some cautery and pressure. We then closed subcutaneous tissues and skin with a  subcuticular stitch. We did inject some Exparel  towards the end of the case in hopes of achieving some decent pain control. Adaptic was applied followed by dry gauze and a fiberglass splint.  Estimated blood loss and intraoperative fluids can be obtained  from anesthesia records as well as accurate tourniquet time.   DISPOSITION: The patient was extubated in the operating room and taken to the  recovery room in stable condition. Plans were for him to go home the same day and follow up in the office in less than a week. I will contact him by phone  tonight.      PAA D: 01/28/2024 4:27:50 pm T: 01/29/2024 1:12:00 am  JOB: [id number]/ [id number]

## 2024-01-30 ENCOUNTER — Encounter (HOSPITAL_COMMUNITY): Payer: Self-pay | Admitting: Orthopaedic Surgery

## 2024-02-07 ENCOUNTER — Telehealth: Payer: Self-pay | Admitting: *Deleted

## 2024-02-07 DIAGNOSIS — G4733 Obstructive sleep apnea (adult) (pediatric): Secondary | ICD-10-CM

## 2024-02-07 DIAGNOSIS — I10 Essential (primary) hypertension: Secondary | ICD-10-CM

## 2024-02-07 DIAGNOSIS — R0683 Snoring: Secondary | ICD-10-CM

## 2024-02-07 NOTE — Telephone Encounter (Signed)
The patient has been notified of the result. Left detailed message on voicemail and informed patient to call back.

## 2024-02-07 NOTE — Telephone Encounter (Signed)
-----   Message from Wilbert Bihari sent at 01/14/2024 11:24 AM EDT ----- Please let patient know that they have sleep apnea and recommend treating with CPAP.  Please order an auto CPAP from 4-15cm H2O with heated humidity and mask of choice.  Order overnight pulse ox on CPAP.  Followup with me in 6 weeks.

## 2024-02-18 NOTE — Telephone Encounter (Signed)
 The patient has been notified of the result and verbalized understanding.  All questions (if any) were answered. Jon Hodges, CMA 02/18/2024 5:52 PM      Upon patient request DME selection is [facility] Patient understands he will be contacted by [facility] to set up his cpap. Patient understands to call if [facility] does not contact him with new setup in a timely manner. Patient understands they will be called once confirmation has been received from [facility] that they have received their new machine to schedule 10 week follow up appointment.   [facility] notified of new cpap order  Please add to airview Patient was grateful for the call and thanked me.

## 2024-06-09 ENCOUNTER — Telehealth: Payer: Self-pay | Admitting: Cardiology

## 2024-06-09 NOTE — Telephone Encounter (Signed)
 Patient called to follow-up on the status of getting a CPAP machine.

## 2024-06-09 NOTE — Telephone Encounter (Signed)
 Pt is aware and agreeable to call dme to set up cpap appointment.

## 2024-07-06 ENCOUNTER — Other Ambulatory Visit: Payer: Self-pay | Admitting: Cardiology

## 2024-07-12 ENCOUNTER — Ambulatory Visit: Payer: Self-pay | Admitting: Cardiology

## 2024-07-13 ENCOUNTER — Emergency Department (HOSPITAL_BASED_OUTPATIENT_CLINIC_OR_DEPARTMENT_OTHER)
Admission: EM | Admit: 2024-07-13 | Discharge: 2024-07-13 | Disposition: A | Discharge status: Home / Self Care | Payer: MEDICAID | Attending: Emergency Medicine | Admitting: Emergency Medicine

## 2024-07-13 ENCOUNTER — Emergency Department (HOSPITAL_BASED_OUTPATIENT_CLINIC_OR_DEPARTMENT_OTHER): Payer: MEDICAID

## 2024-07-13 ENCOUNTER — Other Ambulatory Visit (HOSPITAL_BASED_OUTPATIENT_CLINIC_OR_DEPARTMENT_OTHER): Payer: Self-pay

## 2024-07-13 ENCOUNTER — Encounter (HOSPITAL_BASED_OUTPATIENT_CLINIC_OR_DEPARTMENT_OTHER): Payer: Self-pay

## 2024-07-13 ENCOUNTER — Other Ambulatory Visit: Payer: Self-pay

## 2024-07-13 DIAGNOSIS — S52501A Unspecified fracture of the lower end of right radius, initial encounter for closed fracture: Secondary | ICD-10-CM | POA: Insufficient documentation

## 2024-07-13 DIAGNOSIS — W19XXXA Unspecified fall, initial encounter: Secondary | ICD-10-CM

## 2024-07-13 DIAGNOSIS — M545 Low back pain, unspecified: Secondary | ICD-10-CM | POA: Insufficient documentation

## 2024-07-13 DIAGNOSIS — W001XXA Fall from stairs and steps due to ice and snow, initial encounter: Secondary | ICD-10-CM | POA: Insufficient documentation

## 2024-07-13 DIAGNOSIS — M25551 Pain in right hip: Secondary | ICD-10-CM | POA: Insufficient documentation

## 2024-07-13 DIAGNOSIS — M533 Sacrococcygeal disorders, not elsewhere classified: Secondary | ICD-10-CM | POA: Insufficient documentation

## 2024-07-13 MED ORDER — OXYCODONE-ACETAMINOPHEN 5-325 MG PO TABS
1.0000 | ORAL_TABLET | Freq: Four times a day (QID) | ORAL | 0 refills | Status: AC | PRN
  Filled 2024-07-13: qty 15, 4d supply, fill #0

## 2024-07-13 MED ORDER — OXYCODONE-ACETAMINOPHEN 5-325 MG PO TABS
1.0000 | ORAL_TABLET | Freq: Once | ORAL | Status: AC
  Administered 2024-07-13: 1 via ORAL
  Filled 2024-07-13: qty 1

## 2024-07-13 NOTE — ED Notes (Signed)
Ice applied in triage.  

## 2024-07-13 NOTE — ED Triage Notes (Signed)
 Pt POV with wife d/t fall last night on ice - c/o of right wrist pain.

## 2024-07-13 NOTE — ED Notes (Signed)
 Pt d/c instructions, medications, and follow-up care reviewed with pt. Pt verbalized understanding and had no further questions at time of d/c. Pt CA&Ox4, ambulatory, and in NAD at time of d/c. Pt discharged with family.

## 2024-07-13 NOTE — Discharge Instructions (Signed)
 You sustained a fracture of your wrist for which you were placed in a splint.  Opiates were provided for pain control.  Follow-up outpatient with hand surgery in 1 week's time for repeat assessment.  Remainder of your trauma imaging was overall reassuring.

## 2024-08-07 ENCOUNTER — Ambulatory Visit: Payer: MEDICAID | Admitting: Cardiology

## 2024-09-09 ENCOUNTER — Ambulatory Visit: Admitting: Physician Assistant
